# Patient Record
Sex: Female | Born: 1975 | Race: White | Hispanic: No | Marital: Married | Smoking: Never smoker
Health system: Southern US, Community
[De-identification: ages and names within clinical notes are randomized; demographics above are authoritative.]

## PROBLEM LIST (undated history)

## (undated) ENCOUNTER — Inpatient Hospital Stay (HOSPITAL_COMMUNITY): Payer: Self-pay

## (undated) DIAGNOSIS — O99345 Other mental disorders complicating the puerperium: Secondary | ICD-10-CM

## (undated) DIAGNOSIS — F319 Bipolar disorder, unspecified: Secondary | ICD-10-CM

## (undated) DIAGNOSIS — T4145XA Adverse effect of unspecified anesthetic, initial encounter: Secondary | ICD-10-CM

## (undated) DIAGNOSIS — F53 Postpartum depression: Secondary | ICD-10-CM

## (undated) DIAGNOSIS — I639 Cerebral infarction, unspecified: Secondary | ICD-10-CM

## (undated) DIAGNOSIS — D649 Anemia, unspecified: Secondary | ICD-10-CM

## (undated) DIAGNOSIS — T8859XA Other complications of anesthesia, initial encounter: Secondary | ICD-10-CM

## (undated) DIAGNOSIS — R51 Headache: Secondary | ICD-10-CM

## (undated) DIAGNOSIS — I10 Essential (primary) hypertension: Secondary | ICD-10-CM

## (undated) DIAGNOSIS — R519 Headache, unspecified: Secondary | ICD-10-CM

## (undated) HISTORY — DX: Postpartum depression: F53.0

## (undated) HISTORY — DX: Headache: R51

## (undated) HISTORY — DX: Headache, unspecified: R51.9

## (undated) HISTORY — DX: Other mental disorders complicating the puerperium: O99.345

## (undated) HISTORY — DX: Anemia, unspecified: D64.9

## (undated) HISTORY — PX: APPENDECTOMY: SHX54

## (undated) HISTORY — DX: Adverse effect of unspecified anesthetic, initial encounter: T41.45XA

## (undated) HISTORY — DX: Other complications of anesthesia, initial encounter: T88.59XA

## (undated) HISTORY — DX: Cerebral infarction, unspecified: I63.9

## (undated) HISTORY — PX: MOUTH SURGERY: SHX715

---

## 2004-10-28 ENCOUNTER — Emergency Department (HOSPITAL_COMMUNITY): Admission: EM | Admit: 2004-10-28 | Discharge: 2004-10-28 | Payer: Self-pay | Admitting: Emergency Medicine

## 2008-05-20 ENCOUNTER — Emergency Department (HOSPITAL_BASED_OUTPATIENT_CLINIC_OR_DEPARTMENT_OTHER): Admission: EM | Admit: 2008-05-20 | Discharge: 2008-05-20 | Payer: Self-pay | Admitting: Emergency Medicine

## 2008-05-21 ENCOUNTER — Ambulatory Visit (HOSPITAL_BASED_OUTPATIENT_CLINIC_OR_DEPARTMENT_OTHER): Admission: RE | Admit: 2008-05-21 | Discharge: 2008-05-21 | Payer: Self-pay | Admitting: Emergency Medicine

## 2009-08-31 ENCOUNTER — Emergency Department (HOSPITAL_BASED_OUTPATIENT_CLINIC_OR_DEPARTMENT_OTHER): Admission: EM | Admit: 2009-08-31 | Discharge: 2009-08-31 | Payer: Self-pay | Admitting: Emergency Medicine

## 2010-05-22 IMAGING — CT CT HEAD W/O CM
1 series · 16 of 30 positions shown, 20 images · non-contrast
Comparison: Head CT 10/28/2004

CLINICAL DATA: Headache

CT HEAD WITHOUT CONTRAST
TECHNIQUE: Contiguous axial images were obtained from the base of
the skull through the vertex without contrast.

[Series 2: head 4.8 h37s · axial · 0.41mm/px · z∈[-139,-6]mm · 16 of 32 slices shown, 20 images]
[im 2/32  brain]
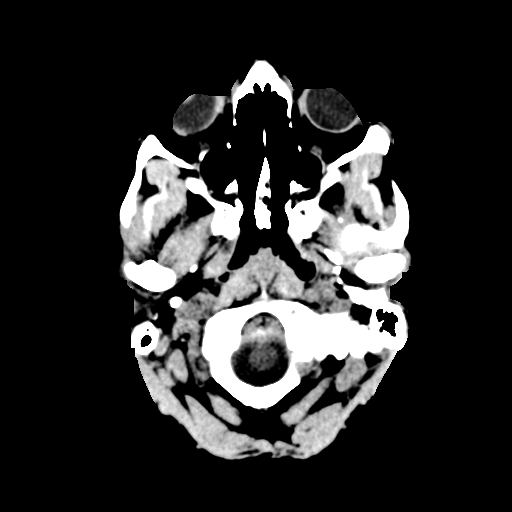
[im 2/32  bone]
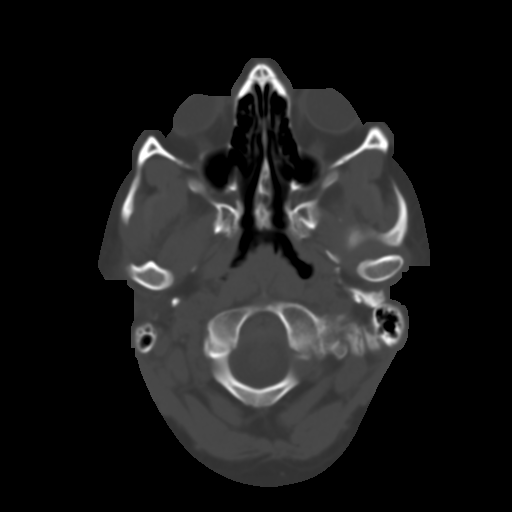
[im 4/32  brain]
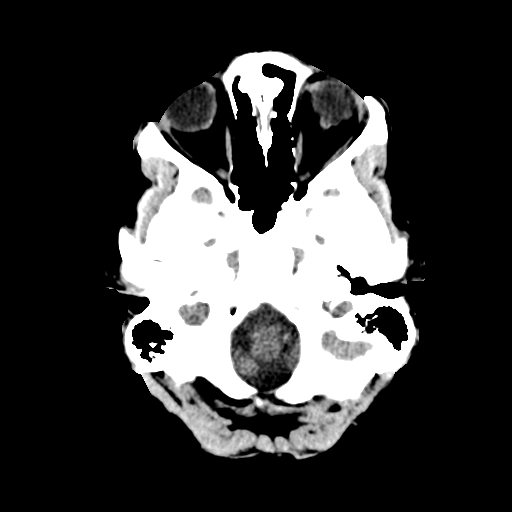
[im 6/32  brain]
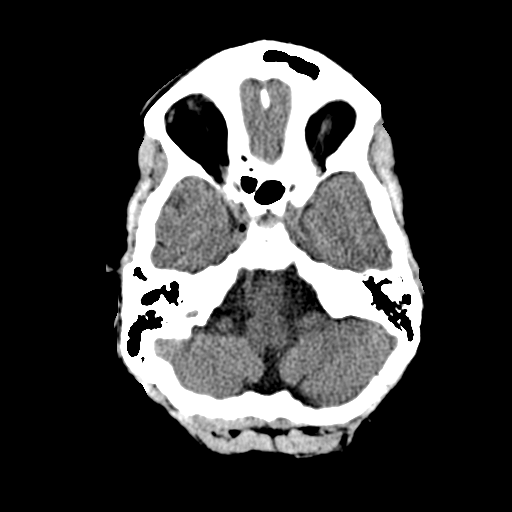
[im 8/32  brain]
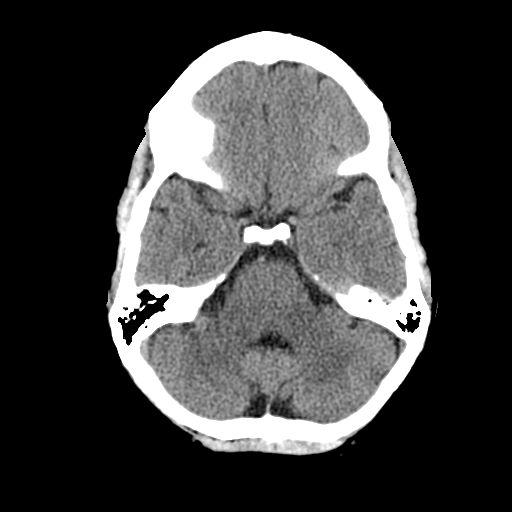
[im 9/32  brain]
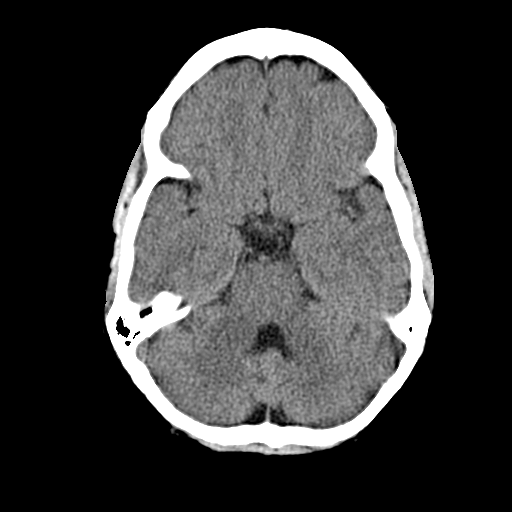
[im 9/32  bone]
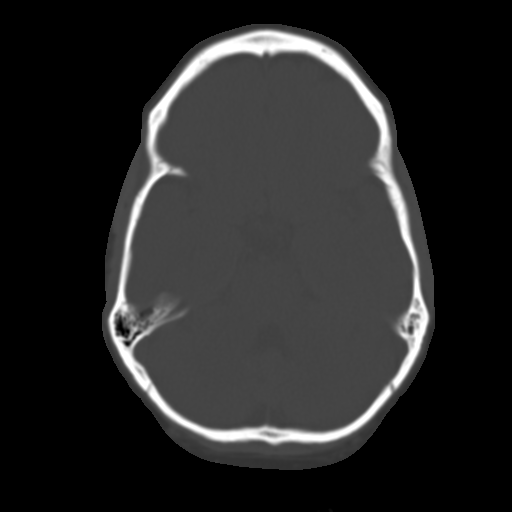
[im 11/32  brain]
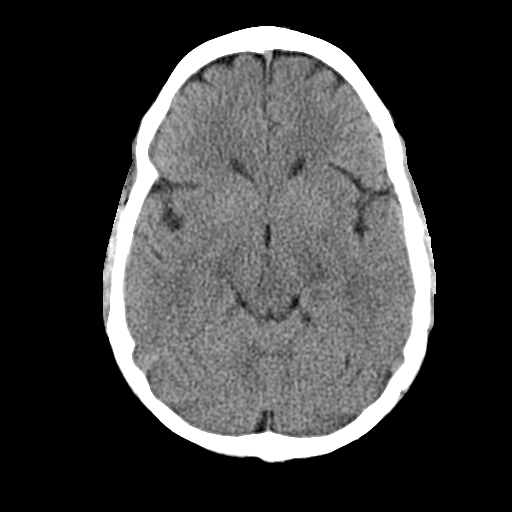
[im 13/32  brain]
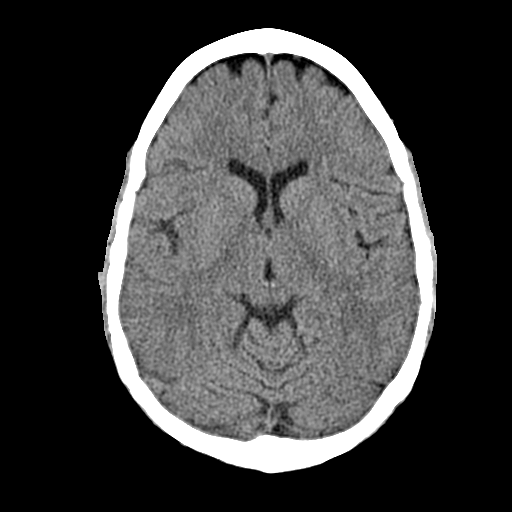
[im 15/32  brain]
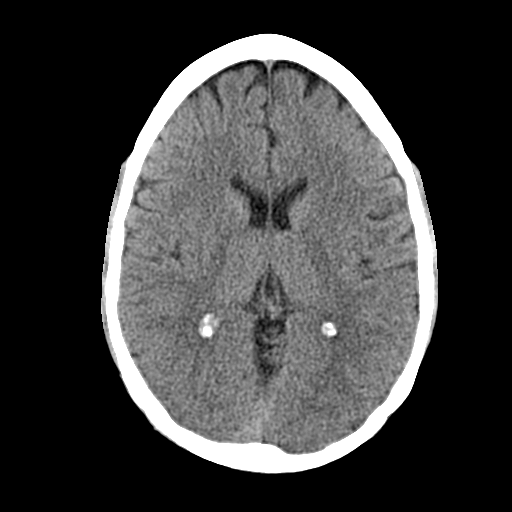
[im 17/32  brain]
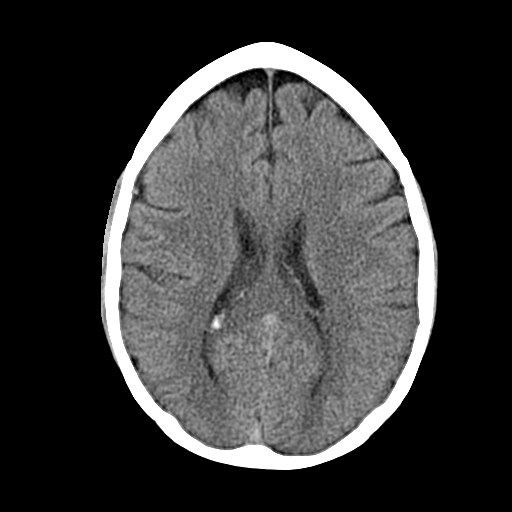
[im 17/32  bone]
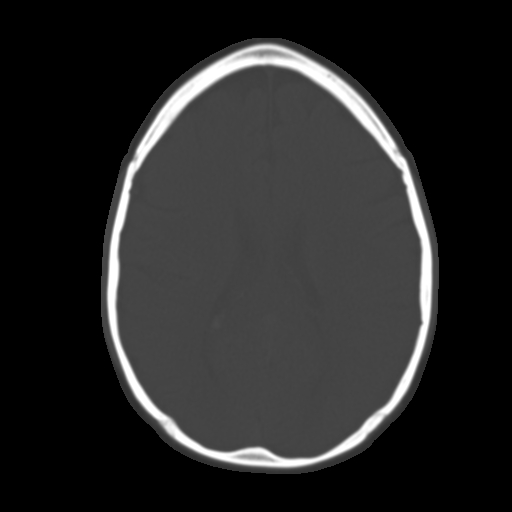
[im 19/32  brain]
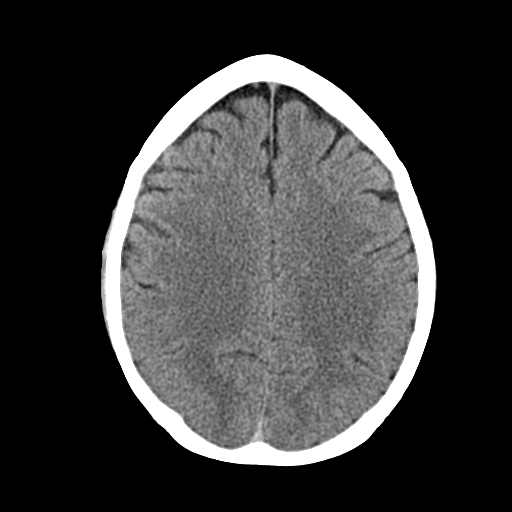
[im 21/32  brain]
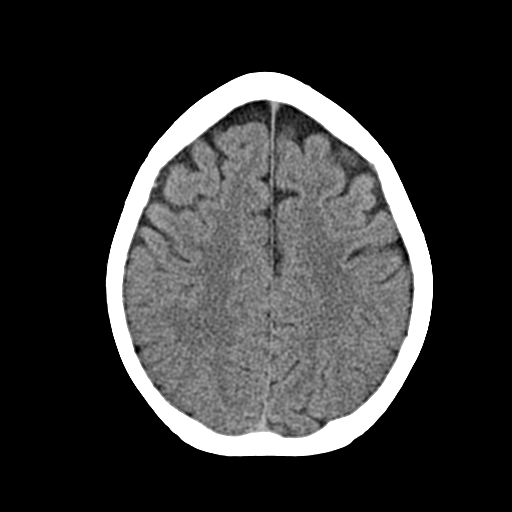
[im 23/32  brain]
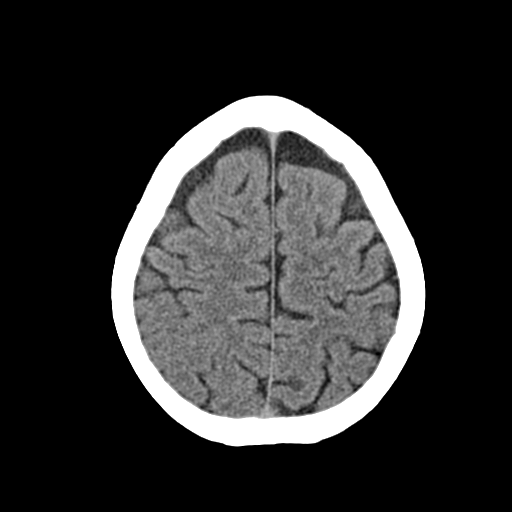
[im 24/32  brain]
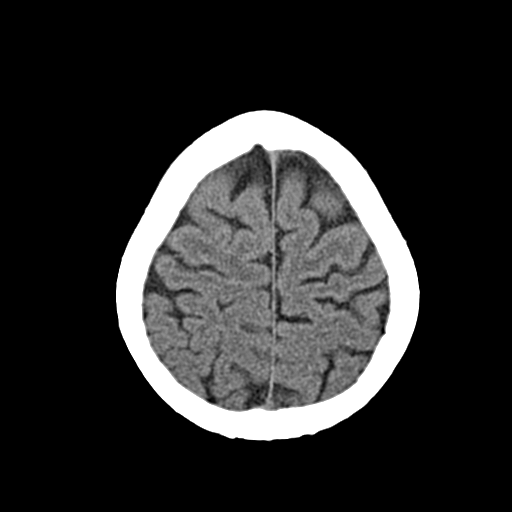
[im 24/32  bone]
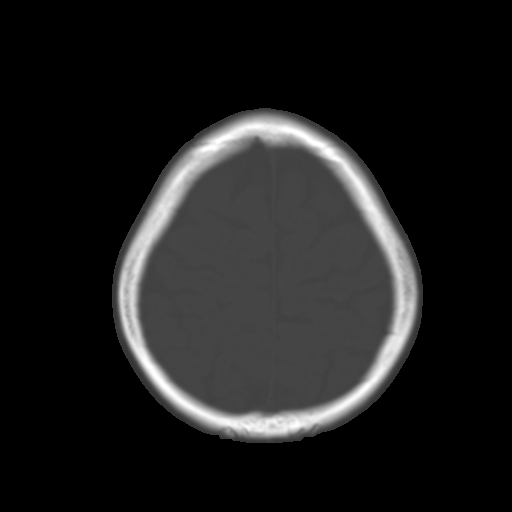
[im 26/32  brain]
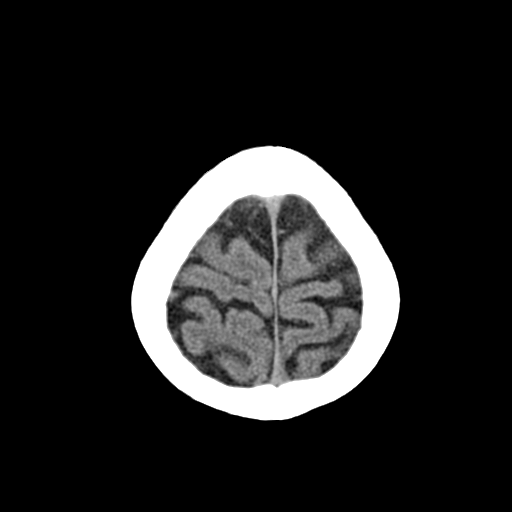
[im 28/32  brain]
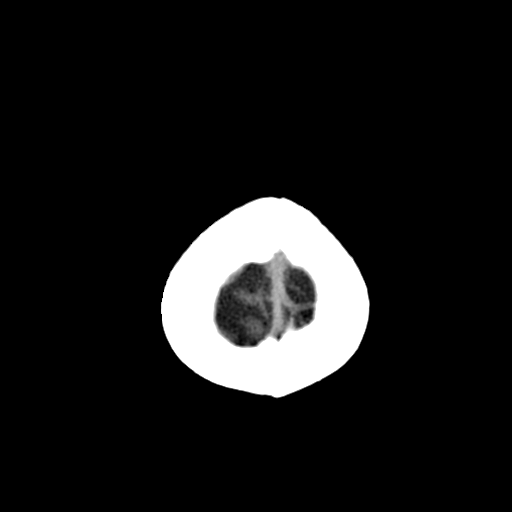
[im 30/32  brain]
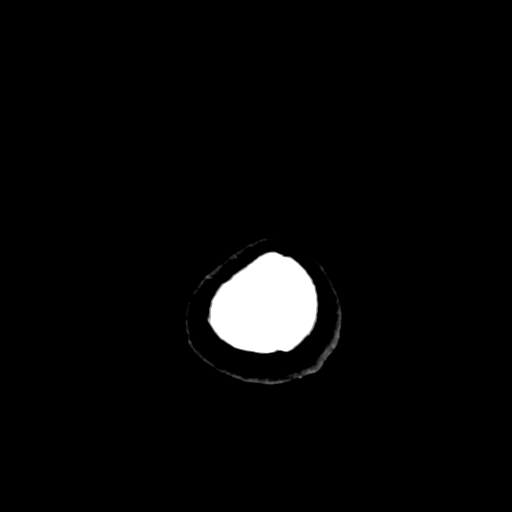

[16 of 30 positions shown; findings below may reference images not displayed]

FINDINGS: No change.  The brain continues to have a normal
appearance without evidence of infarction, mass lesion, hemorrhage,
hydrocephalus or extra-axial collection.  Visualized sinuses,
middle ears and mastoids are clear.  No calvarial abnormality.
IMPRESSION: Normal examination

## 2010-10-12 LAB — CBC
HCT: 41.7 % (ref 36.0–46.0)
Hemoglobin: 14.8 g/dL (ref 12.0–15.0)
MCHC: 35.6 g/dL (ref 30.0–36.0)
MCV: 86.3 fL (ref 78.0–100.0)
Platelets: 344 10*3/uL (ref 150–400)
RBC: 4.82 MIL/uL (ref 3.87–5.11)
RDW: 13 % (ref 11.5–15.5)
WBC: 14.6 10*3/uL — ABNORMAL HIGH (ref 4.0–10.5)

## 2010-10-12 LAB — URINALYSIS, ROUTINE W REFLEX MICROSCOPIC
Glucose, UA: NEGATIVE mg/dL
Ketones, ur: 15 mg/dL — AB
Urobilinogen, UA: 1 mg/dL (ref 0.0–1.0)

## 2010-10-12 LAB — DIFFERENTIAL
Basophils Absolute: 0.1 10*3/uL (ref 0.0–0.1)
Basophils Relative: 1 % (ref 0–1)
Eosinophils Absolute: 0.1 10*3/uL (ref 0.0–0.7)
Eosinophils Relative: 0 % (ref 0–5)
Lymphocytes Relative: 3 % — ABNORMAL LOW (ref 12–46)
Lymphs Abs: 0.5 10*3/uL — ABNORMAL LOW (ref 0.7–4.0)
Monocytes Absolute: 0.4 10*3/uL (ref 0.1–1.0)
Monocytes Relative: 3 % (ref 3–12)
Neutro Abs: 13.4 10*3/uL — ABNORMAL HIGH (ref 1.7–7.7)
Neutrophils Relative %: 93 % — ABNORMAL HIGH (ref 43–77)

## 2010-10-12 LAB — COMPREHENSIVE METABOLIC PANEL
ALT: 11 U/L (ref 0–35)
Alkaline Phosphatase: 78 U/L (ref 39–117)
Chloride: 108 mEq/L (ref 96–112)
GFR calc Af Amer: >60 mL/min (ref >60)
Glucose, Bld: 122 mg/dL — ABNORMAL HIGH (ref 70–99)
Potassium: 4 mEq/L (ref 3.5–5.1)
Sodium: 143 mEq/L (ref 135–145)

## 2010-10-12 LAB — URINE MICROSCOPIC-ADD ON

## 2011-07-24 DIAGNOSIS — I639 Cerebral infarction, unspecified: Secondary | ICD-10-CM

## 2011-07-24 HISTORY — DX: Cerebral infarction, unspecified: I63.9

## 2012-04-20 ENCOUNTER — Emergency Department (HOSPITAL_COMMUNITY)
Admission: EM | Admit: 2012-04-20 | Discharge: 2012-04-20 | Discharge status: Against Medical Advice | Payer: Self-pay | Attending: Emergency Medicine | Admitting: Emergency Medicine

## 2012-04-20 ENCOUNTER — Encounter (HOSPITAL_COMMUNITY): Payer: Self-pay | Admitting: Emergency Medicine

## 2012-04-20 DIAGNOSIS — R55 Syncope and collapse: Secondary | ICD-10-CM | POA: Insufficient documentation

## 2012-04-20 DIAGNOSIS — R5383 Other fatigue: Secondary | ICD-10-CM | POA: Insufficient documentation

## 2012-04-20 DIAGNOSIS — R5381 Other malaise: Secondary | ICD-10-CM | POA: Insufficient documentation

## 2012-04-20 HISTORY — DX: Essential (primary) hypertension: I10

## 2012-04-20 HISTORY — DX: Bipolar disorder, unspecified: F31.9

## 2012-04-20 NOTE — ED Notes (Signed)
The pt decided to leave waited too long

## 2012-04-20 NOTE — ED Notes (Signed)
Pt on new mental health medications- olanzapine 15 mg; oxcarbazepine 600mg ; Reports she takes metoprolol for HTN.

## 2012-04-20 NOTE — ED Notes (Signed)
Pt has been evaluated for numbness in arms and face; weakness in legs; and waking up with palpations. Reports seen at [place], [facility], [place], [place]. PT reports they treat her BP and send her home. EMS BP 148/108

## 2012-05-12 ENCOUNTER — Emergency Department (HOSPITAL_COMMUNITY)
Admission: EM | Admit: 2012-05-12 | Discharge: 2012-05-12 | Disposition: A | Discharge status: Home / Self Care | Payer: BC Managed Care – PPO | Attending: Emergency Medicine | Admitting: Emergency Medicine

## 2012-05-12 ENCOUNTER — Encounter (HOSPITAL_COMMUNITY): Payer: Self-pay | Admitting: Unknown Physician Specialty

## 2012-05-12 DIAGNOSIS — F319 Bipolar disorder, unspecified: Secondary | ICD-10-CM | POA: Insufficient documentation

## 2012-05-12 DIAGNOSIS — Z0389 Encounter for observation for other suspected diseases and conditions ruled out: Secondary | ICD-10-CM | POA: Insufficient documentation

## 2012-05-12 DIAGNOSIS — R531 Weakness: Secondary | ICD-10-CM

## 2012-05-12 DIAGNOSIS — R5381 Other malaise: Secondary | ICD-10-CM | POA: Insufficient documentation

## 2012-05-12 DIAGNOSIS — R5383 Other fatigue: Secondary | ICD-10-CM | POA: Insufficient documentation

## 2012-05-12 DIAGNOSIS — I10 Essential (primary) hypertension: Secondary | ICD-10-CM | POA: Insufficient documentation

## 2012-05-12 LAB — URINALYSIS, ROUTINE W REFLEX MICROSCOPIC
Bilirubin Urine: NEGATIVE
Glucose, UA: NEGATIVE mg/dL
Ketones, ur: NEGATIVE mg/dL
Leukocytes, UA: NEGATIVE
Nitrite: NEGATIVE
Specific Gravity, Urine: 1.006 (ref 1.005–1.030)
pH: 7.5 (ref 5.0–8.0)

## 2012-05-12 LAB — CBC WITH DIFFERENTIAL/PLATELET
Basophils Relative: 1 % (ref 0–1)
HCT: 37.9 % (ref 36.0–46.0)
Lymphocytes Relative: 20 % (ref 12–46)
MCHC: 32.7 g/dL (ref 30.0–36.0)
MCV: 87.5 fL (ref 78.0–100.0)
Monocytes Relative: 7 % (ref 3–12)
Neutro Abs: 6.1 10*3/uL (ref 1.7–7.7)
Neutrophils Relative %: 73 % (ref 43–77)

## 2012-05-12 LAB — COMPREHENSIVE METABOLIC PANEL
Albumin: 4 g/dL (ref 3.5–5.2)
Alkaline Phosphatase: 63 U/L (ref 39–117)
CO2: 26 mEq/L (ref 19–32)
Chloride: 103 mEq/L (ref 96–112)
Creatinine, Ser: 0.61 mg/dL (ref 0.50–1.10)
Potassium: 3.6 mEq/L (ref 3.5–5.1)
Total Bilirubin: 0.3 mg/dL (ref 0.3–1.2)
Total Protein: 7 g/dL (ref 6.0–8.3)

## 2012-05-12 LAB — POCT PREGNANCY, URINE: Preg Test, Ur: NEGATIVE

## 2012-05-12 NOTE — ED Notes (Signed)
Patient states since Sept 6, 2013 she has been feeling weak, facial and head numbness, leg cramping, hands draw up and hypertension. She states she has been checking her BP everyday and states her hypertension medication only keeps her BP down for apprx 15 min.

## 2012-05-12 NOTE — ED Notes (Signed)
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 Patient at this time has no symptoms. Denies nausea, vomiting, diarrhea, abdominal pain and neuro assessment is negative. PA and EDP have been updated.
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
## 2012-05-12 NOTE — ED Notes (Signed)
Food tray given to patient 

## 2012-05-13 NOTE — ED Provider Notes (Signed)
Medical screening examination/treatment/procedure(s) were performed by non-physician practitioner and as supervising physician I was immediately available for consultation/collaboration.   David H Yao, MD 05/13/12 0731 

## 2012-05-19 LAB — MISCELLANEOUS TEST

## 2014-12-15 LAB — HM PAP SMEAR: HM Pap smear: NEGATIVE

## 2016-02-20 DIAGNOSIS — Z8659 Personal history of other mental and behavioral disorders: Secondary | ICD-10-CM | POA: Insufficient documentation

## 2016-02-20 LAB — OB RESULTS CONSOLE HGB/HCT, BLOOD
HEMATOCRIT: 37 %
Hemoglobin: 11.8 g/dL

## 2016-02-20 LAB — OB RESULTS CONSOLE ANTIBODY SCREEN: ANTIBODY SCREEN: NEGATIVE

## 2016-02-20 LAB — OB RESULTS CONSOLE ABO/RH: RH TYPE: POSITIVE

## 2016-02-20 LAB — OB RESULTS CONSOLE HIV ANTIBODY (ROUTINE TESTING): HIV: NONREACTIVE

## 2016-02-20 LAB — OB RESULTS CONSOLE HEPATITIS B SURFACE ANTIGEN: Hepatitis B Surface Ag: NEGATIVE

## 2016-02-20 LAB — OB RESULTS CONSOLE RPR: RPR: NONREACTIVE

## 2016-02-20 LAB — OB RESULTS CONSOLE RUBELLA ANTIBODY, IGM: Rubella: IMMUNE

## 2016-02-21 LAB — OB RESULTS CONSOLE GC/CHLAMYDIA
Chlamydia: NEGATIVE
GC PROBE AMP, GENITAL: NEGATIVE

## 2016-04-30 DIAGNOSIS — O09529 Supervision of elderly multigravida, unspecified trimester: Secondary | ICD-10-CM | POA: Insufficient documentation

## 2016-05-08 ENCOUNTER — Encounter (HOSPITAL_COMMUNITY): Payer: Self-pay | Admitting: Emergency Medicine

## 2016-05-08 ENCOUNTER — Emergency Department (HOSPITAL_COMMUNITY)
Admission: EM | Admit: 2016-05-08 | Discharge: 2016-05-08 | Disposition: A | Discharge status: Home / Self Care | Payer: Medicare Other | Attending: Emergency Medicine | Admitting: Emergency Medicine

## 2016-05-08 DIAGNOSIS — O9989 Other specified diseases and conditions complicating pregnancy, childbirth and the puerperium: Secondary | ICD-10-CM | POA: Insufficient documentation

## 2016-05-08 DIAGNOSIS — O10912 Unspecified pre-existing hypertension complicating pregnancy, second trimester: Secondary | ICD-10-CM | POA: Diagnosis not present

## 2016-05-08 DIAGNOSIS — Z79899 Other long term (current) drug therapy: Secondary | ICD-10-CM | POA: Diagnosis not present

## 2016-05-08 DIAGNOSIS — R51 Headache: Secondary | ICD-10-CM | POA: Diagnosis not present

## 2016-05-08 DIAGNOSIS — Z3A21 21 weeks gestation of pregnancy: Secondary | ICD-10-CM | POA: Insufficient documentation

## 2016-05-08 DIAGNOSIS — R202 Paresthesia of skin: Secondary | ICD-10-CM | POA: Insufficient documentation

## 2016-05-08 DIAGNOSIS — R519 Headache, unspecified: Secondary | ICD-10-CM

## 2016-05-08 LAB — URINALYSIS, ROUTINE W REFLEX MICROSCOPIC
BILIRUBIN URINE: NEGATIVE
Glucose, UA: NEGATIVE mg/dL
Hgb urine dipstick: NEGATIVE
KETONES UR: NEGATIVE mg/dL
Leukocytes, UA: NEGATIVE
NITRITE: NEGATIVE
Protein, ur: NEGATIVE mg/dL
SPECIFIC GRAVITY, URINE: 1.009 (ref 1.005–1.030)
pH: 7 (ref 5.0–8.0)

## 2016-05-08 LAB — COMPREHENSIVE METABOLIC PANEL
ALT: 15 U/L (ref 14–54)
ANION GAP: 7 (ref 5–15)
AST: 21 U/L (ref 15–41)
Albumin: 3 g/dL — ABNORMAL LOW (ref 3.5–5.0)
Alkaline Phosphatase: 43 U/L (ref 38–126)
BUN: <5 mg/dL — ABNORMAL LOW (ref 6–20)
CHLORIDE: 109 mmol/L (ref 101–111)
CO2: 20 mmol/L — ABNORMAL LOW (ref 22–32)
CREATININE: 0.67 mg/dL (ref 0.44–1.00)
Calcium: 8.8 mg/dL — ABNORMAL LOW (ref 8.9–10.3)
Glucose, Bld: 98 mg/dL (ref 65–99)
Potassium: 3.2 mmol/L — ABNORMAL LOW (ref 3.5–5.1)
Sodium: 136 mmol/L (ref 135–145)
Total Bilirubin: 0.6 mg/dL (ref 0.3–1.2)
Total Protein: 5.7 g/dL — ABNORMAL LOW (ref 6.5–8.1)

## 2016-05-08 LAB — CBC WITH DIFFERENTIAL/PLATELET
Basophils Absolute: 0.1 10*3/uL (ref 0.0–0.1)
Basophils Relative: 1 %
EOS PCT: 2 %
Eosinophils Absolute: 0.2 10*3/uL (ref 0.0–0.7)
HCT: 32.3 % — ABNORMAL LOW (ref 36.0–46.0)
Hemoglobin: 10.6 g/dL — ABNORMAL LOW (ref 12.0–15.0)
LYMPHS ABS: 1.9 10*3/uL (ref 0.7–4.0)
LYMPHS PCT: 21 %
MCH: 28.9 pg (ref 26.0–34.0)
MCHC: 32.8 g/dL (ref 30.0–36.0)
MCV: 88 fL (ref 78.0–100.0)
MONO ABS: 0.8 10*3/uL (ref 0.1–1.0)
MONOS PCT: 9 %
Neutro Abs: 5.9 10*3/uL (ref 1.7–7.7)
Neutrophils Relative %: 67 %
PLATELETS: 359 10*3/uL (ref 150–400)
RBC: 3.67 MIL/uL — AB (ref 3.87–5.11)
RDW: 15.3 % (ref 11.5–15.5)
WBC: 8.8 10*3/uL (ref 4.0–10.5)

## 2016-05-08 MED ORDER — METOCLOPRAMIDE HCL 5 MG/ML IJ SOLN
10.0000 mg | Freq: Once | INTRAMUSCULAR | Status: DC
  Filled 2016-05-08: qty 2

## 2016-05-08 MED ORDER — DIPHENHYDRAMINE HCL 50 MG/ML IJ SOLN
25.0000 mg | Freq: Once | INTRAMUSCULAR | Status: DC
  Filled 2016-05-08: qty 1

## 2016-05-08 MED ORDER — ACETAMINOPHEN 500 MG PO TABS
1000.0000 mg | ORAL_TABLET | Freq: Once | ORAL | Status: AC
  Administered 2016-05-08: 1000 mg via ORAL
  Filled 2016-05-08: qty 2

## 2016-05-08 MED ORDER — SODIUM CHLORIDE 0.9 % IV BOLUS (SEPSIS)
1000.0000 mL | Freq: Once | INTRAVENOUS | Status: AC
  Administered 2016-05-08: 1000 mL via INTRAVENOUS

## 2016-05-08 NOTE — ED Triage Notes (Signed)
Pt arrived [facility] from home with c/o numbness starting with her whole body at around 1145PM, and moving to just her arms, currently just her right arm. Pt is [redacted] weeks pregnant, and has a hx of anxiety, HTN, and TIA.

## 2016-05-08 NOTE — ED Provider Notes (Addendum)
[facility]-EMERGENCY DEPT Provider Note   CSN: [id number] Arrival date & time: 05/08/16  7829   By signing my name below, I, Teofilo Pod, attest that this documentation has been prepared under the direction and in the presence of Melene Plan, DO . Electronically Signed: Teofilo Pod, ED Scribe. 05/08/2016. 4:07 AM.   History   Chief Complaint Chief Complaint  Patient presents with  . Numbness   The history is provided by the patient. No language interpreter was used.  Headache   This is a chronic problem. The current episode started more than 1 week ago. The problem occurs constantly. The problem has been gradually improving. The headache is associated with nothing. Pain location: vernix of head. The pain is at a severity of 6/10. The pain is moderate. The pain does not radiate. Pertinent negatives include no fever, no palpitations, no shortness of breath, no nausea and no vomiting. She has tried acetaminophen for the symptoms. The treatment provided no relief.   HPI Comments:  Carolyn Wright is a 40 y.o. female with PMHx of bipolar disorder who presents to the Emergency Department complaining of constant generalized numbness x 3 hours. Pt states that she fell asleep yesterday evening, woke up, and noticed that she was generally numb, primarily in the arms. Pt complains of associated headache, dizziness, tingling to her right hand, abdominal cramping that began yesterday. Pt rates her current pain at 5/10. Pt states that yesterday she was abnormally stressful and that she has been having significant family issues lately. Pt reports that she was brought to the ED by ambulance for similar symptoms in 2013. Pt reports previous history of psychiatric issues associated with her medications. Pt was unable to reach her PCP. Pt has taken tylenol with no relief. Pt denies any recent injury/trauma. Pt denies visual disturbance, vaginal bleeding.   Past Medical History:  Diagnosis Date  .  Bipolar 1 disorder (HCC)   . Hypertension     There are no active problems to display for this patient.   History reviewed. No pertinent surgical history.  OB History    No data available       Home Medications    Prior to Admission medications   Medication Sig Start Date End Date Taking? Authorizing Provider  b complex vitamins capsule Take 1 capsule by mouth daily.    Historical Provider, MD  butalbital-acetaminophen-caffeine (FIORICET, ESGIC) 50-325-40 MG per tablet Take 1 tablet by mouth 2 (two) times daily as needed. For headaches    Historical Provider, MD  Cholecalciferol (VITAMIN D) 2000 UNITS CAPS Take 1 capsule by mouth daily.    Historical Provider, MD  metoprolol (LOPRESSOR) 50 MG tablet Take 50 mg by mouth 2 (two) times daily.    Historical Provider, MD  OVER THE COUNTER MEDICATION Take 1 tablet by mouth daily.    Historical Provider, MD    Family History History reviewed. No pertinent family history.  Social History Social History  Substance Use Topics  . Smoking status: Never Smoker  . Smokeless tobacco: Never Used  . Alcohol use No     Allergies   Depakote [divalproex sodium]; Geodon [ziprasidone hcl]; and Penicillins   Review of Systems Review of Systems  Constitutional: Negative for chills and fever.  HENT: Negative for congestion and rhinorrhea.   Eyes: Negative for redness and visual disturbance.  Respiratory: Negative for shortness of breath and wheezing.   Cardiovascular: Negative for chest pain and palpitations.  Gastrointestinal: Positive for abdominal pain. Negative for  nausea and vomiting.  Genitourinary: Negative for dysuria, urgency and vaginal discharge.  Musculoskeletal: Negative for arthralgias and myalgias.  Skin: Negative for pallor and wound.  Neurological: Positive for dizziness, numbness and headaches.  All other systems reviewed and are negative.    Physical Exam Updated Vital Signs BP 160/92   Pulse 106   Resp 19    SpO2 100%   Physical Exam  Constitutional: She is oriented to person, place, and time. She appears well-developed and well-nourished. No distress.  HENT:  Head: Normocephalic and atraumatic.  Eyes: EOM are normal. Pupils are equal, round, and reactive to light.  Neck: Normal range of motion. Neck supple.  Cardiovascular: Normal rate and regular rhythm.  Exam reveals no gallop and no friction rub.   No murmur heard. Pulmonary/Chest: Effort normal. She has no wheezes. She has no rales.  Abdominal: Soft. She exhibits no distension. There is no tenderness.  Musculoskeletal: She exhibits no edema or tenderness.  Neurological: She is alert and oriented to person, place, and time. She has normal strength. No cranial nerve deficit or sensory deficit. She displays a negative Romberg sign. Coordination and gait normal. GCS eye subscore is 4. GCS verbal subscore is 5. GCS motor subscore is 6. She displays no Babinski's sign on the right side. She displays no Babinski's sign on the left side.  Skin: Skin is warm and dry. She is not diaphoretic.  Psychiatric: She has a normal mood and affect. Her behavior is normal.  Nursing note and vitals reviewed.    ED Treatments / Results  DIAGNOSTIC STUDIES:  Oxygen Saturation is 98% on RA, normal by my interpretation.    COORDINATION OF CARE:  4:07 AM Discussed treatment plan with pt at bedside and pt agreed to plan.   Labs (all labs ordered are listed, but only abnormal results are displayed) Labs Reviewed  CBC WITH DIFFERENTIAL/PLATELET - Abnormal; Notable for the following:       Result Value   RBC 3.67 (*)    Hemoglobin 10.6 (*)    HCT 32.3 (*)    All other components within normal limits  COMPREHENSIVE METABOLIC PANEL - Abnormal; Notable for the following:    Potassium 3.2 (*)    CO2 20 (*)    BUN <5 (*)    Calcium 8.8 (*)    Total Protein 5.7 (*)    Albumin 3.0 (*)    All other components within normal limits  URINALYSIS, ROUTINE W  REFLEX MICROSCOPIC (NOT AT [facility])    EKG  EKG Interpretation None       Radiology No results found.  Procedures Procedures (including critical care time)  Medications Ordered in ED Medications  metoCLOPramide (REGLAN) injection 10 mg (10 mg Intravenous Refused 05/08/16 0453)  diphenhydrAMINE (BENADRYL) injection 25 mg (25 mg Intravenous Refused 05/08/16 0454)  sodium chloride 0.9 % bolus 1,000 mL (0 mLs Intravenous Stopped 05/08/16 0611)  acetaminophen (TYLENOL) tablet 1,000 mg (1,000 mg Oral Given 05/08/16 0604)     Initial Impression / Assessment and Plan / ED Course  I have reviewed the triage vital signs and the nursing notes.  Pertinent labs & imaging results that were available during my care of the patient were reviewed by me and considered in my medical decision making (see chart for details).  Clinical Course    40 yo F with paresthesias.  Going on for past couple days, usually occurs with stress. Patient has hx about 3 years ago with episodes that sound stress related  vs PRESS.  On labetalol for BP, [redacted] weeks pregnant.  No complications thus far.  Labs without findings consistent with pre-eclampsia.  Symptoms improved without intervention.  Will have follow up with OB for BP management.   6:30 AM:  I have discussed the diagnosis/risks/treatment options with the patient and family and believe the pt to be eligible for discharge home to follow-up with OB. We also discussed returning to the ED immediately if new or worsening sx occur. We discussed the sx which are most concerning (e.g., sudden worsening pain, fever, inability to tolerate by mouth) that necessitate immediate return. Medications administered to the patient during their visit and any new prescriptions provided to the patient are listed below.  Medications given during this visit Medications  metoCLOPramide (REGLAN) injection 10 mg (10 mg Intravenous Refused 05/08/16 0453)  diphenhydrAMINE (BENADRYL)  injection 25 mg (25 mg Intravenous Refused 05/08/16 0454)  sodium chloride 0.9 % bolus 1,000 mL (0 mLs Intravenous Stopped 05/08/16 0611)  acetaminophen (TYLENOL) tablet 1,000 mg (1,000 mg Oral Given 05/08/16 0604)     The patient appears reasonably screen and/or stabilized for discharge and I doubt any other medical condition or other [facility] requiring further screening, evaluation, or treatment in the ED at this time prior to discharge.     Final Clinical Impressions(s) / ED Diagnoses   Final diagnoses:  Paresthesia  Nonintractable headache, unspecified chronicity pattern, unspecified headache type    New Prescriptions New Prescriptions   No medications on file  I personally performed the services described in this documentation, which was scribed in my presence. The recorded information has been reviewed and is accurate.      Melene Planan Nasiah Lehenbauer, DO 05/08/16 0630    Melene Planan Ghislaine Harcum, DO 05/08/16 0630

## 2016-05-08 NOTE — Discharge Instructions (Signed)
Discuss your BP with your OB.

## 2016-05-09 DIAGNOSIS — F411 Generalized anxiety disorder: Secondary | ICD-10-CM | POA: Insufficient documentation

## 2016-05-09 LAB — BASIC METABOLIC PANEL
BUN: 5 mg/dL (ref 4–21)
Creatinine: 0.6 mg/dL (ref 0.5–1.1)
SODIUM: 138 mmol/L (ref 137–147)

## 2016-05-09 LAB — HEPATIC FUNCTION PANEL: Bilirubin, Total: 0.2 mg/dL

## 2016-05-11 DIAGNOSIS — F312 Bipolar disorder, current episode manic severe with psychotic features: Secondary | ICD-10-CM | POA: Insufficient documentation

## 2016-05-12 LAB — LIPID PANEL
CHOLESTEROL: 205 mg/dL — AB (ref 0–200)
HDL: 42 mg/dL (ref 35–70)
LDL CALC: 120 mg/dL
Triglycerides: 217 mg/dL — AB (ref 40–160)

## 2016-05-12 LAB — BASIC METABOLIC PANEL: Glucose: 139 mg/dL

## 2016-05-18 LAB — OB RESULTS CONSOLE TSH: TSH: 1.57

## 2016-05-18 LAB — TSH: TSH: 1.57 u[IU]/mL (ref 0.41–5.90)

## 2016-05-20 ENCOUNTER — Encounter (HOSPITAL_COMMUNITY): Payer: Self-pay | Admitting: *Deleted

## 2016-05-20 ENCOUNTER — Emergency Department (HOSPITAL_COMMUNITY)
Admission: EM | Admit: 2016-05-20 | Discharge: 2016-05-20 | Disposition: A | Discharge status: Home / Self Care | Payer: Medicare Other | Attending: Emergency Medicine | Admitting: Emergency Medicine

## 2016-05-20 DIAGNOSIS — Z7982 Long term (current) use of aspirin: Secondary | ICD-10-CM | POA: Insufficient documentation

## 2016-05-20 DIAGNOSIS — Z79899 Other long term (current) drug therapy: Secondary | ICD-10-CM

## 2016-05-20 DIAGNOSIS — Z880 Allergy status to penicillin: Secondary | ICD-10-CM | POA: Diagnosis not present

## 2016-05-20 DIAGNOSIS — F3010 Manic episode without psychotic symptoms, unspecified: Secondary | ICD-10-CM | POA: Insufficient documentation

## 2016-05-20 DIAGNOSIS — F309 Manic episode, unspecified: Secondary | ICD-10-CM | POA: Diagnosis not present

## 2016-05-20 DIAGNOSIS — Z3A20 20 weeks gestation of pregnancy: Secondary | ICD-10-CM | POA: Diagnosis not present

## 2016-05-20 DIAGNOSIS — Z888 Allergy status to other drugs, medicaments and biological substances status: Secondary | ICD-10-CM

## 2016-05-20 DIAGNOSIS — I10 Essential (primary) hypertension: Secondary | ICD-10-CM | POA: Diagnosis not present

## 2016-05-20 DIAGNOSIS — O99342 Other mental disorders complicating pregnancy, second trimester: Secondary | ICD-10-CM | POA: Diagnosis not present

## 2016-05-20 LAB — VALPROIC ACID LEVEL: Valproic Acid Lvl: <10 ug/mL — ABNORMAL LOW (ref 50.0–100.0)

## 2016-05-20 LAB — CBC
HEMATOCRIT: 37.5 % (ref 36.0–46.0)
HEMOGLOBIN: 12.2 g/dL (ref 12.0–15.0)
MCH: 29.3 pg (ref 26.0–34.0)
MCHC: 32.5 g/dL (ref 30.0–36.0)
MCV: 89.9 fL (ref 78.0–100.0)
Platelets: 481 10*3/uL — ABNORMAL HIGH (ref 150–400)
RBC: 4.17 MIL/uL (ref 3.87–5.11)
RDW: 15.1 % (ref 11.5–15.5)
WBC: 11 10*3/uL — ABNORMAL HIGH (ref 4.0–10.5)

## 2016-05-20 LAB — COMPREHENSIVE METABOLIC PANEL
ALBUMIN: 4 g/dL (ref 3.5–5.0)
ALK PHOS: 56 U/L (ref 38–126)
ALT: 31 U/L (ref 14–54)
AST: 40 U/L (ref 15–41)
Anion gap: 9 (ref 5–15)
BUN: 8 mg/dL (ref 6–20)
CALCIUM: 8.9 mg/dL (ref 8.9–10.3)
CHLORIDE: 105 mmol/L (ref 101–111)
CO2: 21 mmol/L — AB (ref 22–32)
CREATININE: 0.66 mg/dL (ref 0.44–1.00)
GFR calc non Af Amer: >60 mL/min (ref >60)
GLUCOSE: 102 mg/dL — AB (ref 65–99)
Potassium: 3.6 mmol/L (ref 3.5–5.1)
SODIUM: 135 mmol/L (ref 135–145)
Total Bilirubin: 0.4 mg/dL (ref 0.3–1.2)
Total Protein: 7.8 g/dL (ref 6.5–8.1)

## 2016-05-20 LAB — ACETAMINOPHEN LEVEL: Acetaminophen (Tylenol), Serum: <10 ug/mL — ABNORMAL LOW (ref 10–30)

## 2016-05-20 LAB — LITHIUM LEVEL: Lithium Lvl: <0.06 mmol/L — ABNORMAL LOW (ref 0.60–1.20)

## 2016-05-20 LAB — ETHANOL: Alcohol, Ethyl (B): <5 mg/dL (ref <5)

## 2016-05-20 LAB — SALICYLATE LEVEL: Salicylate Lvl: <7 mg/dL (ref 2.8–30.0)

## 2016-05-20 MED ORDER — HALOPERIDOL LACTATE 5 MG/ML IJ SOLN: 5.0000 mg | Freq: Once | INTRAMUSCULAR | Status: DC

## 2016-05-20 MED ORDER — METOPROLOL TARTRATE 25 MG PO TABS: 50.0000 mg | ORAL_TABLET | Freq: Once | ORAL | Status: DC

## 2016-05-20 MED ORDER — LORAZEPAM 2 MG/ML IJ SOLN
1.0000 mg | Freq: Once | INTRAMUSCULAR | Status: DC
  Filled 2016-05-20: qty 1

## 2016-05-20 NOTE — ED Notes (Signed)
Patient refused EKG.
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
## 2016-05-20 NOTE — ED Notes (Signed)
Per EDP, IVC paperwork has been completed and faxed.  Sts the Pt has been making threats toward others, so she feels the paperwork will hold up.
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
## 2016-05-20 NOTE — ED Notes (Signed)
Patient verbally demanding-attempted to redirect patient with no response-informed MD/PA of patient's requests
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
## 2016-05-20 NOTE — ED Notes (Signed)
PATIENT REFUSED TO HAVE BLOOD COLLECTED

## 2016-05-20 NOTE — ED Notes (Signed)
Pt is talking about her family, she is blaming others for her problems, tearful, difficult to redirect.

## 2016-05-20 NOTE — ED Notes (Signed)
TTS at bedside. 

## 2016-05-20 NOTE — ED Notes (Signed)
Pt refused blood work, sts had everything done this week already.
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
## 2016-05-20 NOTE — Progress Notes (Signed)
CSW logged patient's notice of commitment change paperwork into IVC log book.  

## 2016-05-20 NOTE — ED Notes (Signed)
Communications called to inform that pt is continuously calling 911 from the room because she is afraid she will be IVC'd.  Pt is clearly in a manic state at this time.  Dispatch is going to send a [employer] officer over to sit w/ pt until the decision to d/c or IVC is made.  Phone taken out of room to prevent additional misuse of 911.

## 2016-05-20 NOTE — ED Notes (Addendum)
Pt is very argumentative, threatening to leave if not seen by dr soon. Sts "I'm only here to get something to help me sleep, otherwise I will leave and buy Unisom". She has not stop talking (fast and loud) since she came in. Her husband reports it's been over 2 days since she had any sleep. Husband also reports that while pt was admitted to [facility], she had a habit of hiding her medications under her upper lip and throwing them away afterwards.

## 2016-05-20 NOTE — ED Notes (Signed)
Patient refused Ativan-MD informed of allergy-patient loud, demanding and hyper verbal-unable to redirect at this time-[employer] security outside patient's door-threatening staff-will continue to monitor

## 2016-05-20 NOTE — ED Triage Notes (Signed)
Pt has been manic threatening staff since our arrival at 0700, multiple staff including [employer] off duty have been trying to reason with pt, pt continues to be loud, threatening as well as husband who has been asked to wait in the Waiting Area. Pt now c/o "abd cramping, about to start my period, going to loose my baby" " you can not hold me" " I demand to see my husband" pt refusing VS and threatening the PA in verbal conversation to other staff" At this time we will consult [facility], pt now threatening writer and praying to God asking for the medical field [place] to have repentance" " yelling for strength and threatening staff to have there job". Spoke with [facility] response, Dr Arlyce DicePulumbo speaking with her. Pt singing in loud voice tones. Husband now at bedside with no change in pts behavior.

## 2016-05-23 ENCOUNTER — Encounter (HOSPITAL_COMMUNITY): Payer: Self-pay

## 2016-05-23 ENCOUNTER — Inpatient Hospital Stay (HOSPITAL_COMMUNITY)
Admission: AD | Admit: 2016-05-23 | Discharge: 2016-05-23 | Disposition: A | Discharge status: Home / Self Care | Payer: Medicare Other | Source: Ambulatory Visit | Attending: Family Medicine | Admitting: Family Medicine

## 2016-05-23 DIAGNOSIS — O10012 Pre-existing essential hypertension complicating pregnancy, second trimester: Secondary | ICD-10-CM | POA: Insufficient documentation

## 2016-05-23 DIAGNOSIS — Z3A22 22 weeks gestation of pregnancy: Secondary | ICD-10-CM | POA: Diagnosis not present

## 2016-05-23 DIAGNOSIS — O9989 Other specified diseases and conditions complicating pregnancy, childbirth and the puerperium: Secondary | ICD-10-CM | POA: Diagnosis not present

## 2016-05-23 DIAGNOSIS — R51 Headache: Secondary | ICD-10-CM | POA: Diagnosis present

## 2016-05-23 LAB — URINALYSIS, ROUTINE W REFLEX MICROSCOPIC
BILIRUBIN URINE: NEGATIVE
GLUCOSE, UA: NEGATIVE mg/dL
HGB URINE DIPSTICK: NEGATIVE
KETONES UR: NEGATIVE mg/dL
Leukocytes, UA: NEGATIVE
Nitrite: NEGATIVE
PROTEIN: NEGATIVE mg/dL
Specific Gravity, Urine: 1.025 (ref 1.005–1.030)
pH: 6.5 (ref 5.0–8.0)

## 2016-05-23 NOTE — MAU Provider Note (Signed)
History     CSN: [id number]  Arrival date and time: 05/23/16 0641   None     Chief Complaint  Patient presents with  . Headache   Patient is a 40 yo G4P1 at 7225w2d who presented via EMS with concerns for TIA. Prior to entering the room, patient was heard singing through the close door Per the patient: Patient was at baseline state of health until this morning when she woke from sleep with SOB. Patient states that she woke with whole body numbness as well as complete paralysis of the right side of her body. Patient states that she had complete loss of sensation in all of her extremities, except for tingling in her right hand. 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 These events were witnessed by the patient's father who was home at the time. Father was not present during the interview. Patient states that her symptoms have fully resolved Patient recently [facility] involuntary [facility]. Patient states that she has a court appointment at 1100 this morning.     Past Medical History:  Diagnosis Date  . Bipolar 1 disorder (HCC)   . Hypertension     Past Surgical History:  Procedure Laterality Date  . APPENDECTOMY      History reviewed. No pertinent family history.  Social History  Substance Use Topics  . Smoking status: Never Smoker  . Smokeless tobacco: Never Used  . Alcohol use No    Allergies:  Allergies  Allergen Reactions  . Depakote [Divalproex Sodium] Palpitations  . Geodon [Ziprasidone Hcl]     CARDIAC ARREST  . Zyprexa [Olanzapine] Anxiety    Uncontrollable pacing and shaking.   Marland Kitchen. Risperidone Other (See Comments)    Hyperprolactinemia  . Ativan [Lorazepam]     HALLUCINATIONS   . Azithromycin   . Norco [Hydrocodone-Acetaminophen]   . Penicillins     Has patient had a PCN reaction causing immediate rash,  facial/tongue/throat swelling, SOB or lightheadedness with hypotension: No Has patient had a PCN reaction causing severe rash involving mucus membranes or skin necrosis: No Has patient had a PCN reaction that required hospitalization No Has patient had a PCN reaction occurring within the last 10 years: YES If all of the above answers are "NO", then may proceed with Cephalosporin use.  Randol Kern. Abilify [Aripiprazole] Anxiety    Uncontrollable pacing and shaking.   . Ziprasidone Palpitations    Prescriptions Prior to Admission  Medication Sig Dispense Refill Last Dose  . acetaminophen (TYLENOL) 325 MG tablet Take 650 mg by mouth every 6 (six) hours as needed for mild pain.   05/22/2016 at Unknown time  . aspirin EC 81 MG tablet Take 81 mg by mouth daily.   05/22/2016 at Unknown time  . butalbital-acetaminophen-caffeine (FIORICET, ESGIC) 50-325-40 MG per tablet Take 2 tablets by mouth 2 (two) times daily as needed for headache.    05/23/2016 at Unknown time  . cefdinir (OMNICEF) 300 MG capsule Take 300 mg by mouth daily. STARTED 10 DAY SUPPLY ON 05/19/2016   Past Week at Unknown time  . hydrOXYzine (VISTARIL) 50 MG capsule Take 50 mg by mouth every 4 (four) hours as needed for anxiety.    Past Week at Unknown time  . labetalol (NORMODYNE) 100 MG tablet Take 100 mg by mouth 2 (two) times daily.   05/22/2016 at 2000  . Magnesium 100  MG CAPS Take 100 mg by mouth daily.   05/22/2016 at Unknown time  . Prenatal Vit-Fe Fumarate-FA (PRENATAL VITAMINS) 28-0.8 MG TABS Take 1 tablet by mouth daily.    05/22/2016 at Unknown time    Review of Systems  Constitutional: Negative for chills and fever.  Eyes: Positive for blurred vision, photophobia and pain.  Respiratory: Positive for shortness of breath.   Cardiovascular: Negative for chest pain.  Gastrointestinal: Negative for nausea and vomiting.  Genitourinary: Negative for dysuria.  Musculoskeletal: Negative for myalgias.  Neurological: Positive for  dizziness, sensory change, focal weakness and headaches.  Psychiatric/Behavioral: The patient is nervous/anxious.    Physical Exam   Blood pressure 94/74, pulse 87, temperature 98 F (36.7 C), temperature source Oral, resp. rate 20, SpO2 100 %.  Physical Exam  Constitutional: She appears well-developed and well-nourished.  Eyes: Conjunctivae and EOM are normal. Pupils are equal, round, and reactive to light.  Neurological: She is alert. She has normal strength. No cranial nerve deficit or sensory deficit. She displays a negative Romberg sign. Coordination and gait normal.  Psychiatric: Her mood appears anxious. Her speech is tangential.    MAU Course  Procedures  MDM In MAU patient will not sit still for another exam. She has BP >150/90. She has not taken her labetolol for her cHTN today. When asked to stay for labs she reports "God will heal Her" She was told she would have to sign out AMA and [facility].  Assessment and Plan  1. Elevated with headache: advised labs and PR/CR ratio But patient left against medical advice.  Josue D Santos 05/23/2016, 9:19 AM

## 2016-05-23 NOTE — Progress Notes (Signed)
Patient presents today with sudden headache that concerned her about her baby.  "The [name] is my great physician and there is nothing you can do about it, he will heal me, but I just wanted to check on my baby."  Her heart rate was 152, so that's good."

## 2016-05-23 NOTE — Discharge Instructions (Signed)
Tension Headache A tension headache is pain, pressure, or aching that is felt over the front and sides of your head. These headaches can last from 30 minutes to several days. HOME CARE Managing Pain  Take over-the-counter and prescription medicines only as told by your doctor.  Lie down in a dark, quiet room when you have a headache.  If directed, apply ice to your head and neck area:  Put ice in a plastic bag.  Place a towel between your skin and the bag.  Leave the ice on for 20 minutes, 2-3 times per day.  Use a heating pad or a hot shower to apply heat to your head and neck area as told by your doctor. Eating and Drinking  Eat meals on a regular schedule.  Do not drink a lot of alcohol.  Do not use a lot of caffeine, or stop using caffeine. General Instructions  Keep all follow-up visits as told by your doctor. This is important.  Keep a journal to find out if certain things bring on headaches. For example, write down:  What you eat and drink.  How much sleep you get.  Any change to your diet or medicines.  Try getting a massage, or doing other things that help you to relax.  Lessen stress.  Sit up straight. Do not tighten (tense) your muscles.  Do not use tobacco products. This includes cigarettes, chewing tobacco, or e-cigarettes. If you need help quitting, ask your doctor.  Exercise regularly as told by your doctor.  Get enough sleep. This may mean 7-9 hours of sleep. GET HELP IF:  Your symptoms are not helped by medicine.  You have a headache that feels different from your usual headache.  You feel sick to your stomach (nauseous) or you throw up (vomit).  You have a fever. GET HELP RIGHT AWAY IF:  Your headache becomes very bad.  You keep throwing up.  You have a stiff neck.  You have trouble seeing.  You have trouble speaking.  You have pain in your eye or ear.  Your muscles are weak or you lose muscle control.  You lose your balance  or you have trouble walking.  You feel like you will pass out (faint) or you pass out.  You have confusion.   This information is not intended to replace advice given to you by your health care provider. Make sure you discuss any questions you have with your health care provider.   Document Released: 10/03/2009 Document Revised: 03/30/2015 Document Reviewed: 11/01/2014 Elsevier Interactive Patient Education 2016 Elsevier Inc.  

## 2016-05-23 NOTE — Progress Notes (Signed)
Went over DC paperwork with patient and checked another set of VS prior to DC home with pulse being 104 and BP 156/98 as patient was walking around room talking to RN.  Dr. Ernestina PennaNicholas Schenk notified of latest VS and he spoke with patient stating he would like her to stay in the hospital for further evaluation with that elevated BP.  Patient refused.  "No I'm fine, my dad is waiting for me and I really need to go."  Patient signed out AMA and verbalized understanding that it is recommended that she stay here for further evaluation, but she insists on leaving now.

## 2016-07-19 DIAGNOSIS — O169 Unspecified maternal hypertension, unspecified trimester: Secondary | ICD-10-CM | POA: Insufficient documentation

## 2016-08-02 ENCOUNTER — Encounter: Payer: Self-pay | Admitting: *Deleted

## 2016-08-02 ENCOUNTER — Encounter: Payer: Self-pay | Admitting: Obstetrics and Gynecology

## 2016-08-15 ENCOUNTER — Encounter: Payer: Self-pay | Admitting: Obstetrics and Gynecology

## 2016-08-17 ENCOUNTER — Encounter: Payer: Self-pay | Admitting: Obstetrics & Gynecology

## 2016-08-20 ENCOUNTER — Inpatient Hospital Stay (HOSPITAL_COMMUNITY): Payer: Medicare Other

## 2016-08-20 ENCOUNTER — Inpatient Hospital Stay (HOSPITAL_COMMUNITY)
Admission: AD | Admit: 2016-08-20 | Discharge: 2016-08-20 | Disposition: A | Discharge status: Home / Self Care | Payer: Medicare Other | Source: Ambulatory Visit | Attending: Obstetrics & Gynecology | Admitting: Obstetrics & Gynecology

## 2016-08-20 DIAGNOSIS — Z8673 Personal history of transient ischemic attack (TIA), and cerebral infarction without residual deficits: Secondary | ICD-10-CM | POA: Diagnosis not present

## 2016-08-20 DIAGNOSIS — O99343 Other mental disorders complicating pregnancy, third trimester: Secondary | ICD-10-CM | POA: Diagnosis not present

## 2016-08-20 DIAGNOSIS — F319 Bipolar disorder, unspecified: Secondary | ICD-10-CM | POA: Insufficient documentation

## 2016-08-20 DIAGNOSIS — Z880 Allergy status to penicillin: Secondary | ICD-10-CM | POA: Insufficient documentation

## 2016-08-20 DIAGNOSIS — Z3A35 35 weeks gestation of pregnancy: Secondary | ICD-10-CM | POA: Insufficient documentation

## 2016-08-20 DIAGNOSIS — O288 Other abnormal findings on antenatal screening of mother: Secondary | ICD-10-CM

## 2016-08-20 DIAGNOSIS — Z7982 Long term (current) use of aspirin: Secondary | ICD-10-CM | POA: Insufficient documentation

## 2016-08-20 DIAGNOSIS — O10013 Pre-existing essential hypertension complicating pregnancy, third trimester: Secondary | ICD-10-CM | POA: Diagnosis not present

## 2016-08-20 NOTE — MAU Note (Signed)
Pt presents stating she has been having contractions since 8pm, not timing them. Denies bleeding or leaking. Reports good fetal movement. [facility]. States she ran out of her metoprolol

## 2016-08-20 NOTE — Discharge Instructions (Signed)
Introduction Patient Name: ________________________________________________ Patient Due Date: ____________________ What is a fetal movement count? A fetal movement count is the number of times that you feel your baby move during a certain amount of time. This may also be called a fetal kick count. A fetal movement count is recommended for every pregnant woman. You may be asked to start counting fetal movements as early as week 28 of your pregnancy. Pay attention to when your baby is most active. You may notice your baby's sleep and wake cycles. You may also notice things that make your baby move more. You should do a fetal movement count:  When your baby is normally most active.  At the same time each day. A good time to count movements is while you are resting, after having something to eat and drink. How do I count fetal movements? 1. Find a quiet, comfortable area. Sit, or lie down on your side. 2. Write down the date, the start time and stop time, and the number of movements that you felt between those two times. Take this information with you to your health care visits. 3. For 2 hours, count kicks, flutters, swishes, rolls, and jabs. You should feel at least 10 movements during 2 hours. 4. You may stop counting after you have felt 10 movements. 5. If you do not feel 10 movements in 2 hours, have something to eat and drink. Then, keep resting and counting for 1 hour. If you feel at least 4 movements during that hour, you may stop counting. Contact a health care provider if:  You feel fewer than 4 movements in 2 hours.  Your baby is not moving like he or she usually does. Date: ____________ Start time: ____________ Stop time: ____________ Movements: ____________ Date: ____________ Start time: ____________ Stop time: ____________ Movements: ____________ Date: ____________ Start time: ____________ Stop time: ____________ Movements: ____________ Date: ____________ Start time: ____________  Stop time: ____________ Movements: ____________ Date: ____________ Start time: ____________ Stop time: ____________ Movements: ____________ Date: ____________ Start time: ____________ Stop time: ____________ Movements: ____________ Date: ____________ Start time: ____________ Stop time: ____________ Movements: ____________ Date: ____________ Start time: ____________ Stop time: ____________ Movements: ____________ Date: ____________ Start time: ____________ Stop time: ____________ Movements: ____________ This information is not intended to replace advice given to you by your health care provider. Make sure you discuss any questions you have with your health care provider. Document Released: 08/08/2006 Document Revised: 03/07/2016 Document Reviewed: 08/18/2015 Elsevier Interactive Patient Education  2017 [employer]  

## 2016-08-20 NOTE — MAU Provider Note (Signed)
Obstetric Resident MAU Note  Chief Complaint:  Contractions   None    HPI: Carolyn Wright is a 41 y.o. G4P1021 at [redacted]w[redacted]d who presents to maternity admissions reporting uterine contractions of increasing intensity and frequency. Upon arrival, the patient reported an improvement in the intensity of this cramping and only uterine irritability was detected on the monitor.   Denies contractions, leakage of fluid or vaginal bleeding. Good fetal movement.   Pregnancy Course: Receives care at [facility] History of Bipolar I disorder- reports being on lithium as well as carbatrol Chronic hypertension- she is supposed to be on metoprolol 25mg  daily. BP well-controlled at this time.  Postpartum psychosis with previous pregnancy History of TIAs  Patient Active Problem List   Diagnosis Date Noted  . Manic behavior (HCC)     Past Medical History:  Diagnosis Date  . Anemia   . Bipolar 1 disorder (HCC)   . Depression complicating pregnancy, postpartum    pp psychosis, 2006  . Headache   . Hypertension   . transient ischemic attack 2013    OB History  Gravida Para Term Preterm AB Living  4 1 1  0 2 1  SAB TAB Ectopic Multiple Live Births  2 0 0 0 1    # Outcome Date GA Lbr Len/2nd Weight Sex Delivery Anes PTL Lv  4 Current           3 SAB 06/20/15          2 Term 09/30/04 [redacted]w[redacted]d  6 lb 14 oz (3.118 kg) F Vag-Spont EPI N LIV  1 SAB 2002              Past Surgical History:  Procedure Laterality Date  . APPENDECTOMY      Family History: Family History  Problem Relation Age of Onset  . Diabetes Mother   . Hypertension Mother   . Heart disease Maternal Uncle   . Cancer Maternal Grandfather     lung    Social History: Social History  Substance Use Topics  . Smoking status: Never Smoker  . Smokeless tobacco: Never Used  . Alcohol use No    Allergies:  Allergies  Allergen Reactions  . Depakote [Divalproex Sodium] Palpitations  . Geodon [Ziprasidone Hcl]     CARDIAC ARREST  .  Zyprexa [Olanzapine] Anxiety    Uncontrollable pacing and shaking.   Marland Kitchen Risperidone Other (See Comments)    Hyperprolactinemia  . Ativan [Lorazepam]     HALLUCINATIONS   . Azithromycin   . Norco [Hydrocodone-Acetaminophen]   . Penicillins     Has patient had a PCN reaction causing immediate rash, facial/tongue/throat swelling, SOB or lightheadedness with hypotension: No Has patient had a PCN reaction causing severe rash involving mucus membranes or skin necrosis: No Has patient had a PCN reaction that required hospitalization No Has patient had a PCN reaction occurring within the last 10 years: YES If all of the above answers are "NO", then may proceed with Cephalosporin use.  Randol Kern [Aripiprazole] Anxiety    Uncontrollable pacing and shaking.   . Ziprasidone Palpitations    No prescriptions prior to admission.    ROS: Pertinent findings in history of present illness.  Physical Exam  Blood pressure 121/67, pulse 103, temperature 98 F (36.7 C), temperature source Oral, resp. rate 20. CONSTITUTIONAL: Well-developed, well-nourished female in no acute distress.  HENT:  Normocephalic, atraumatic, moist mucus membranes EYES: Conjunctivae and EOM are normal. No scleral icterus.  NECK: Normal range of motion, supple  SKIN: Skin is warm and dry. No rash noted. Not diaphoretic. No erythema. No pallor. NEUROLGIC: Alert and oriented to person, place, and time. No focal defects. PSYCHIATRIC: Normal mood and affect. Normal behavior. Normal judgment and thought content. CARDIOVASCULAR: Normal heart rate noted, regular rhythm RESPIRATORY: Effort and breath sounds normal, no problems with respiration noted ABDOMEN: Soft, nontender, nondistended, gravid appropriate for gestational age MUSCULOSKELETAL: Normal range of motion. No edema and no tenderness. 2+ distal pulses.  Dilation: Closed Exam by:: Barbarann EhlersM. Campbell, MD; A. Madilyn FiremanHayes, RN  FHT:  Baseline 130s , moderate variability, minimal  accelerations present, no decelerations Contractions: uterine irritability; irregular pattern   Labs: No results found for this or any previous visit (from the past 24 hour(s)).  Imaging:  No results found.  MAU Course: Patient presented initially as a labor evaluation with contractions of increasing frequency and intensity. Her cervical exam revealed a closed cervix. Her FHT was not reactive however. It had good variability, but did not have adequate accelerations. A BPP was performed and returned 8/8. Suspect that patient's underlying medications for mood stabilization are likely responsible. Patient reported a decrease in the frequency and intensity of contractions while here. She reported feeling ready to go home.   Assessment: 1. Non-reactive NST (non-stress test)     Plan: Discharge home Labor precautions reviewed Follow up with OB provider within the next 1-2 weeks.    Allergies as of 08/20/2016      Reactions   Depakote [divalproex Sodium] Palpitations   Geodon [ziprasidone Hcl]    CARDIAC ARREST   Zyprexa [olanzapine] Anxiety   Uncontrollable pacing and shaking.    Risperidone Other (See Comments)   Hyperprolactinemia   Ativan [lorazepam]    HALLUCINATIONS   Azithromycin    Norco [hydrocodone-acetaminophen]    Penicillins    Has patient had a PCN reaction causing immediate rash, facial/tongue/throat swelling, SOB or lightheadedness with hypotension: No Has patient had a PCN reaction causing severe rash involving mucus membranes or skin necrosis: No Has patient had a PCN reaction that required hospitalization No Has patient had a PCN reaction occurring within the last 10 years: YES If all of the above answers are "NO", then may proceed with Cephalosporin use.   Abilify [aripiprazole] Anxiety   Uncontrollable pacing and shaking.    Ziprasidone Palpitations      Medication List    STOP taking these medications   cefdinir 300 MG capsule Commonly known as:   OMNICEF     TAKE these medications   acetaminophen 325 MG tablet Commonly known as:  TYLENOL Take 650 mg by mouth every 6 (six) hours as needed for mild pain.   aspirin EC 81 MG tablet Take 81 mg by mouth daily.   butalbital-acetaminophen-caffeine 50-325-40 MG tablet Commonly known as:  FIORICET, ESGIC Take 2 tablets by mouth 2 (two) times daily as needed for headache.   hydrOXYzine 50 MG capsule Commonly known as:  VISTARIL Take 50 mg by mouth every 4 (four) hours as needed for anxiety.   labetalol 100 MG tablet Commonly known as:  NORMODYNE Take 100 mg by mouth 2 (two) times daily.   Magnesium 100 MG Caps Take 100 mg by mouth daily.   Prenatal Vitamins 28-0.8 MG Tabs Take 1 tablet by mouth daily.       Lise AuerMegan C Campbell, MD PGY-2 08/20/2016 9:09 AM

## 2016-08-22 ENCOUNTER — Encounter: Payer: Self-pay | Admitting: Family Medicine

## 2016-08-27 ENCOUNTER — Telehealth: Payer: Self-pay | Admitting: *Deleted

## 2016-08-27 ENCOUNTER — Other Ambulatory Visit (HOSPITAL_COMMUNITY)
Admission: RE | Admit: 2016-08-27 | Discharge: 2016-08-27 | Disposition: A | Discharge status: Home / Self Care | Payer: Medicare Other | Source: Ambulatory Visit | Attending: Family Medicine | Admitting: Family Medicine

## 2016-08-27 ENCOUNTER — Encounter: Payer: Self-pay | Admitting: Obstetrics & Gynecology

## 2016-08-27 ENCOUNTER — Ambulatory Visit (INDEPENDENT_AMBULATORY_CARE_PROVIDER_SITE_OTHER): Payer: Medicare Other | Admitting: Obstetrics & Gynecology

## 2016-08-27 VITALS — BP 113/66 | HR 91 | Wt 205.2 lb

## 2016-08-27 DIAGNOSIS — Z113 Encounter for screening for infections with a predominantly sexual mode of transmission: Secondary | ICD-10-CM

## 2016-08-27 DIAGNOSIS — O10913 Unspecified pre-existing hypertension complicating pregnancy, third trimester: Secondary | ICD-10-CM | POA: Diagnosis not present

## 2016-08-27 DIAGNOSIS — O09523 Supervision of elderly multigravida, third trimester: Secondary | ICD-10-CM | POA: Diagnosis present

## 2016-08-27 DIAGNOSIS — O0993 Supervision of high risk pregnancy, unspecified, third trimester: Secondary | ICD-10-CM

## 2016-08-27 DIAGNOSIS — O09529 Supervision of elderly multigravida, unspecified trimester: Secondary | ICD-10-CM

## 2016-08-27 LAB — OB RESULTS CONSOLE GC/CHLAMYDIA: GC PROBE AMP, GENITAL: NEGATIVE

## 2016-08-27 LAB — OB RESULTS CONSOLE GBS: GBS: POSITIVE
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
## 2016-08-27 NOTE — Telephone Encounter (Signed)
Scheduled u/s for 2/8 @ 0830. Called patient and notified her of the appointment. Understanding voiced.

## 2016-08-28 LAB — CERVICOVAGINAL ANCILLARY ONLY
CHLAMYDIA, DNA PROBE: NEGATIVE
NEISSERIA GONORRHEA: NEGATIVE

## 2016-08-31 ENCOUNTER — Ambulatory Visit (HOSPITAL_COMMUNITY): Payer: Medicare Other

## 2016-09-02 LAB — CULTURE, STREPTOCOCCUS GRP B W/SUSCEPT

## 2016-09-04 ENCOUNTER — Ambulatory Visit (HOSPITAL_COMMUNITY)
Admission: RE | Admit: 2016-09-04 | Discharge: 2016-09-04 | Disposition: A | Discharge status: Home / Self Care | Payer: Medicare Other | Source: Ambulatory Visit | Attending: Obstetrics & Gynecology | Admitting: Obstetrics & Gynecology

## 2016-09-04 ENCOUNTER — Encounter (HOSPITAL_COMMUNITY): Payer: Self-pay

## 2016-09-04 ENCOUNTER — Other Ambulatory Visit: Payer: Self-pay | Admitting: Obstetrics & Gynecology

## 2016-09-04 DIAGNOSIS — O09523 Supervision of elderly multigravida, third trimester: Secondary | ICD-10-CM

## 2016-09-04 DIAGNOSIS — Z3A37 37 weeks gestation of pregnancy: Secondary | ICD-10-CM

## 2016-09-04 DIAGNOSIS — Z3A35 35 weeks gestation of pregnancy: Secondary | ICD-10-CM

## 2016-09-04 DIAGNOSIS — O0993 Supervision of high risk pregnancy, unspecified, third trimester: Secondary | ICD-10-CM

## 2016-09-04 DIAGNOSIS — Z363 Encounter for antenatal screening for malformations: Secondary | ICD-10-CM | POA: Insufficient documentation

## 2016-09-05 ENCOUNTER — Inpatient Hospital Stay (HOSPITAL_COMMUNITY): Payer: Medicare Other | Admitting: Anesthesiology

## 2016-09-05 ENCOUNTER — Encounter (HOSPITAL_COMMUNITY): Payer: Self-pay

## 2016-09-05 ENCOUNTER — Other Ambulatory Visit: Payer: Self-pay | Admitting: Obstetrics & Gynecology

## 2016-09-05 ENCOUNTER — Ambulatory Visit (HOSPITAL_COMMUNITY)
Admission: RE | Admit: 2016-09-05 | Discharge: 2016-09-05 | Disposition: A | Discharge status: Home / Self Care | Payer: Medicare Other | Source: Ambulatory Visit | Attending: Obstetrics & Gynecology | Admitting: Obstetrics & Gynecology

## 2016-09-05 ENCOUNTER — Inpatient Hospital Stay (HOSPITAL_COMMUNITY)
Admission: AD | Admit: 2016-09-05 | Discharge: 2016-09-09 | DRG: 766 | Disposition: A | Discharge status: Home / Self Care | Payer: Medicare Other | Source: Ambulatory Visit | Attending: Obstetrics and Gynecology | Admitting: Obstetrics and Gynecology

## 2016-09-05 ENCOUNTER — Ambulatory Visit (INDEPENDENT_AMBULATORY_CARE_PROVIDER_SITE_OTHER): Payer: Medicare Other | Admitting: Obstetrics & Gynecology

## 2016-09-05 ENCOUNTER — Encounter: Payer: Self-pay | Admitting: Obstetrics & Gynecology

## 2016-09-05 VITALS — BP 112/62 | HR 94 | Wt 207.7 lb

## 2016-09-05 DIAGNOSIS — O169 Unspecified maternal hypertension, unspecified trimester: Secondary | ICD-10-CM

## 2016-09-05 DIAGNOSIS — O288 Other abnormal findings on antenatal screening of mother: Secondary | ICD-10-CM

## 2016-09-05 DIAGNOSIS — O163 Unspecified maternal hypertension, third trimester: Secondary | ICD-10-CM | POA: Diagnosis present

## 2016-09-05 DIAGNOSIS — O10013 Pre-existing essential hypertension complicating pregnancy, third trimester: Secondary | ICD-10-CM | POA: Insufficient documentation

## 2016-09-05 DIAGNOSIS — F319 Bipolar disorder, unspecified: Secondary | ICD-10-CM | POA: Diagnosis present

## 2016-09-05 DIAGNOSIS — O1092 Unspecified pre-existing hypertension complicating childbirth: Secondary | ICD-10-CM | POA: Diagnosis not present

## 2016-09-05 DIAGNOSIS — O09529 Supervision of elderly multigravida, unspecified trimester: Secondary | ICD-10-CM

## 2016-09-05 DIAGNOSIS — O99344 Other mental disorders complicating childbirth: Secondary | ICD-10-CM | POA: Diagnosis present

## 2016-09-05 DIAGNOSIS — Z3A37 37 weeks gestation of pregnancy: Secondary | ICD-10-CM

## 2016-09-05 DIAGNOSIS — O1002 Pre-existing essential hypertension complicating childbirth: Principal | ICD-10-CM | POA: Diagnosis present

## 2016-09-05 DIAGNOSIS — Z8673 Personal history of transient ischemic attack (TIA), and cerebral infarction without residual deficits: Secondary | ICD-10-CM | POA: Diagnosis not present

## 2016-09-05 DIAGNOSIS — Z823 Family history of stroke: Secondary | ICD-10-CM

## 2016-09-05 DIAGNOSIS — Z8249 Family history of ischemic heart disease and other diseases of the circulatory system: Secondary | ICD-10-CM | POA: Diagnosis not present

## 2016-09-05 DIAGNOSIS — O09523 Supervision of elderly multigravida, third trimester: Secondary | ICD-10-CM | POA: Insufficient documentation

## 2016-09-05 DIAGNOSIS — F411 Generalized anxiety disorder: Secondary | ICD-10-CM | POA: Diagnosis present

## 2016-09-05 DIAGNOSIS — O0993 Supervision of high risk pregnancy, unspecified, third trimester: Secondary | ICD-10-CM

## 2016-09-05 DIAGNOSIS — Z833 Family history of diabetes mellitus: Secondary | ICD-10-CM

## 2016-09-05 DIAGNOSIS — O99824 Streptococcus B carrier state complicating childbirth: Secondary | ICD-10-CM | POA: Diagnosis present

## 2016-09-05 DIAGNOSIS — O0990 Supervision of high risk pregnancy, unspecified, unspecified trimester: Secondary | ICD-10-CM

## 2016-09-05 DIAGNOSIS — O283 Abnormal ultrasonic finding on antenatal screening of mother: Secondary | ICD-10-CM | POA: Insufficient documentation

## 2016-09-05 LAB — DIFFERENTIAL
BASOS PCT: 0 %
Basophils Absolute: 0.1 10*3/uL (ref 0.0–0.1)
EOS ABS: 0.6 10*3/uL (ref 0.0–0.7)
EOS PCT: 5 %
LYMPHS ABS: 1.4 10*3/uL (ref 0.7–4.0)
Lymphocytes Relative: 10 %
Monocytes Absolute: 0.9 10*3/uL (ref 0.1–1.0)
Monocytes Relative: 7 %
NEUTROS PCT: 79 %
Neutro Abs: 11 10*3/uL — ABNORMAL HIGH (ref 1.7–7.7)

## 2016-09-05 LAB — CBC
HCT: 33 % — ABNORMAL LOW (ref 36.0–46.0)
HEMOGLOBIN: 11 g/dL — AB (ref 12.0–15.0)
MCH: 30.8 pg (ref 26.0–34.0)
MCHC: 33.3 g/dL (ref 30.0–36.0)
MCV: 92.4 fL (ref 78.0–100.0)
Platelets: 329 10*3/uL (ref 150–400)
RBC: 3.57 MIL/uL — AB (ref 3.87–5.11)
RDW: 17.4 % — ABNORMAL HIGH (ref 11.5–15.5)
WBC: 14.3 10*3/uL — ABNORMAL HIGH (ref 4.0–10.5)

## 2016-09-05 LAB — TYPE AND SCREEN
ABO/RH(D): O POS
ANTIBODY SCREEN: NEGATIVE

## 2016-09-05 LAB — ABO/RH: ABO/RH(D): O POS

## 2016-09-05 MED ORDER — METOPROLOL TARTRATE 25 MG PO TABS
25.0000 mg | ORAL_TABLET | Freq: Two times a day (BID) | ORAL | Status: DC
  Administered 2016-09-05: 25 mg via ORAL
  Filled 2016-09-05 (×2): qty 1

## 2016-09-05 MED ORDER — LACTATED RINGERS IV SOLN: 500.0000 mL | Freq: Once | INTRAVENOUS | Status: DC

## 2016-09-05 MED ORDER — LACTATED RINGERS IV SOLN
500.0000 mL | Freq: Once | INTRAVENOUS | Status: AC
  Administered 2016-09-05: 500 mL via INTRAVENOUS

## 2016-09-05 MED ORDER — MAGNESIUM OXIDE 400 (241.3 MG) MG PO TABS
200.0000 mg | ORAL_TABLET | Freq: Every day | ORAL | Status: DC
  Filled 2016-09-05 (×2): qty 0.5

## 2016-09-05 MED ORDER — MAGNESIUM 100 MG PO CAPS: 100.0000 mg | ORAL_CAPSULE | Freq: Every day | ORAL | Status: DC

## 2016-09-05 MED ORDER — PHENYLEPHRINE 40 MCG/ML (10ML) SYRINGE FOR IV PUSH (FOR BLOOD PRESSURE SUPPORT): 80.0000 ug | PREFILLED_SYRINGE | INTRAVENOUS | Status: DC | PRN

## 2016-09-05 MED ORDER — EPHEDRINE 5 MG/ML INJ: 10.0000 mg | INTRAVENOUS | Status: DC | PRN

## 2016-09-05 MED ORDER — TERBUTALINE SULFATE 1 MG/ML IJ SOLN
0.2500 mg | Freq: Once | INTRAMUSCULAR | Status: AC | PRN
  Administered 2016-09-06: 0.25 mg via SUBCUTANEOUS
  Filled 2016-09-05: qty 1

## 2016-09-05 MED ORDER — OXYTOCIN 40 UNITS IN LACTATED RINGERS INFUSION - SIMPLE MED: 2.5000 [IU]/h | INTRAVENOUS | Status: DC

## 2016-09-05 MED ORDER — LACTATED RINGERS IV SOLN
INTRAVENOUS | Status: DC
  Administered 2016-09-05 – 2016-09-06 (×5): via INTRAVENOUS

## 2016-09-05 MED ORDER — LITHIUM CARBONATE 300 MG PO CAPS
300.0000 mg | ORAL_CAPSULE | Freq: Every day | ORAL | Status: DC
  Filled 2016-09-05: qty 1

## 2016-09-05 MED ORDER — HYDROXYZINE PAMOATE 50 MG PO CAPS
50.0000 mg | ORAL_CAPSULE | ORAL | Status: DC | PRN
  Filled 2016-09-05: qty 1

## 2016-09-05 MED ORDER — OXYTOCIN BOLUS FROM INFUSION: 500.0000 mL | Freq: Once | INTRAVENOUS | Status: DC

## 2016-09-05 MED ORDER — LITHIUM CARBONATE 300 MG PO CAPS
600.0000 mg | ORAL_CAPSULE | Freq: Every day | ORAL | Status: DC
  Administered 2016-09-05: 600 mg via ORAL
  Filled 2016-09-05: qty 2

## 2016-09-05 MED ORDER — LIDOCAINE HCL (PF) 1 % IJ SOLN: 30.0000 mL | INTRAMUSCULAR | Status: DC | PRN

## 2016-09-05 MED ORDER — LITHIUM CITRATE 300 MG/5 ML PO SYRP: 600.0000 mg | Freq: Every day | ORAL | Status: DC

## 2016-09-05 MED ORDER — CLINDAMYCIN PHOSPHATE 900 MG/50ML IV SOLN
900.0000 mg | Freq: Three times a day (TID) | INTRAVENOUS | Status: DC
  Administered 2016-09-05 – 2016-09-06 (×3): 900 mg via INTRAVENOUS
  Filled 2016-09-05 (×3): qty 50

## 2016-09-05 MED ORDER — PHENYLEPHRINE 40 MCG/ML (10ML) SYRINGE FOR IV PUSH (FOR BLOOD PRESSURE SUPPORT)
80.0000 ug | PREFILLED_SYRINGE | INTRAVENOUS | Status: DC | PRN
  Filled 2016-09-05: qty 10

## 2016-09-05 MED ORDER — ACETAMINOPHEN 325 MG PO TABS: 650.0000 mg | ORAL_TABLET | ORAL | Status: DC | PRN

## 2016-09-05 MED ORDER — ONDANSETRON HCL 4 MG/2ML IJ SOLN: 4.0000 mg | Freq: Four times a day (QID) | INTRAMUSCULAR | Status: DC | PRN

## 2016-09-05 MED ORDER — DIPHENHYDRAMINE HCL 50 MG/ML IJ SOLN: 12.5000 mg | INTRAMUSCULAR | Status: DC | PRN

## 2016-09-05 MED ORDER — OXYTOCIN 40 UNITS IN LACTATED RINGERS INFUSION - SIMPLE MED
1.0000 m[IU]/min | INTRAVENOUS | Status: DC
  Administered 2016-09-05: 1 m[IU]/min via INTRAVENOUS
  Filled 2016-09-05: qty 1000

## 2016-09-05 MED ORDER — LITHIUM CITRATE 300 MG/5 ML PO SYRP: 300.0000 mg | Freq: Every day | ORAL | Status: DC

## 2016-09-05 MED ORDER — FENTANYL 2.5 MCG/ML BUPIVACAINE 1/10 % EPIDURAL INFUSION (WH - ANES)
14.0000 mL/h | INTRAMUSCULAR | Status: DC | PRN
  Administered 2016-09-05: 14 mL/h via EPIDURAL
  Filled 2016-09-05: qty 100

## 2016-09-05 MED ORDER — LACTATED RINGERS IV SOLN: 500.0000 mL | INTRAVENOUS | Status: DC | PRN

## 2016-09-05 MED ORDER — MAGNESIUM OXIDE 400 (241.3 MG) MG PO TABS: 100.0000 mg | ORAL_TABLET | Freq: Every day | ORAL | Status: DC

## 2016-09-05 MED ORDER — LIDOCAINE HCL (PF) 1 % IJ SOLN
INTRAMUSCULAR | Status: DC | PRN
  Administered 2016-09-05: 5 mL via EPIDURAL

## 2016-09-05 MED ORDER — MAGNESIUM 200 MG PO TABS
200.0000 mg | ORAL_TABLET | Freq: Every day | ORAL | Status: DC
  Filled 2016-09-05 (×2): qty 1

## 2016-09-05 MED ORDER — CLOZAPINE 100 MG PO TABS
200.0000 mg | ORAL_TABLET | Freq: Every day | ORAL | Status: DC
  Administered 2016-09-05: 200 mg via ORAL
  Filled 2016-09-05: qty 2

## 2016-09-05 MED ORDER — FENTANYL 2.5 MCG/ML BUPIVACAINE 1/10 % EPIDURAL INFUSION (WH - ANES): 14.0000 mL/h | INTRAMUSCULAR | Status: DC | PRN

## 2016-09-05 MED ORDER — SOD CITRATE-CITRIC ACID 500-334 MG/5ML PO SOLN
30.0000 mL | ORAL | Status: DC | PRN
  Administered 2016-09-05 – 2016-09-06 (×3): 30 mL via ORAL
  Filled 2016-09-05 (×3): qty 15

## 2016-09-05 MED ORDER — MAGNESIUM 200 MG PO TABS
100.0000 mg | ORAL_TABLET | Freq: Every day | ORAL | Status: DC
  Filled 2016-09-05: qty 1

## 2016-09-05 NOTE — Anesthesia Pain Management Evaluation Note (Signed)
  [facility] Pain Management Visit Note  Patient: Carolyn Wright, 41 y.o., female  "Hello I am a member of the anesthesia team at [facility]. We have an anesthesia team available at all times to provide care throughout the hospital, including epidural management and anesthesia for C-section. I don't know your plan for the delivery whether it a natural birth, water birth, IV sedation, nitrous supplementation, doula or epidural, but we want to meet your pain goals."   1.Was your pain managed to your expectations on prior hospitalizations?   Yes   2.What is your expectation for pain management during this hospitalization?     Epidural  3.How can we help you reach that goal?   Record the patient's initial score and the patient's pain goal.   Pain: 4  Pain Goal: 5 [facility] wants you to be able to say your pain was always managed very well.  Laban EmperorMalinova,Angeletta Goelz Hristova 09/05/2016

## 2016-09-05 NOTE — Progress Notes (Signed)
US yesterday reviewed, >90 % ile   PRENATAL VISIT NOTE  Subjective:  Carolyn Wright is a 41 y.o. G4P1021 at 7242w2d being seen today for ongoing prenatal care.  She is currently monitored for the following issues for this high-risk pregnancy and has Manic behavior (HCC); Hypertension affecting pregnancy, antepartum; History of postpartum psychosis; Generalized anxiety disorder; Antepartum multigravida of advanced maternal age; Bipolar affective disorder, manic, severe, with psychotic behavior (HCC); and Supervision of high-risk pregnancy on her problem list.  Patient reports no complaints.  Contractions: Not present. Vag. Bleeding: None.  Movement: Present. Denies leaking of fluid.   The following portions of the patient's history were reviewed and updated as appropriate: allergies, current medications, past family history, past medical history, past social history, past surgical history and problem list. Problem list updated.  Objective:   Vitals:   09/05/16 1044  BP: 112/62  Pulse: 94  Weight: 207 lb 11.2 oz (94.2 kg)    Fetal Status: Fetal Heart Rate (bpm): NST Fundal Height: 39 cm Movement: Present     General:  Alert, oriented and cooperative. Patient is in no acute distress.  Skin: Skin is warm and dry. No rash noted.   Cardiovascular: Normal heart rate noted  Respiratory: Normal respiratory effort, no problems with respiration noted  Abdomen: Soft, gravid, appropriate for gestational age. Pain/Pressure: Present     Pelvic:  Cervical exam deferred        Extremities: Normal range of motion.  Edema: Trace  Mental Status: Normal mood and affect. Normal behavior. Normal judgment and thought content.   Assessment and Plan:  Pregnancy: G4P1021 at 5642w2d  1. Hypertension affecting pregnancy, antepartum Normal BP, IOL 39 weeks  2. Antepartum multigravida of advanced maternal age Growth US reviewed  3. Supervision of high risk pregnancy, antepartum NST today  Term labor symptoms  and general obstetric precautions including but not limited to vaginal bleeding, contractions, leaking of fluid and fetal movement were reviewed in detail with the patient. Please refer to After Visit Summary for other counseling recommendations.  Return in about 1 week (around 09/12/2016) for needs NST 2/week.   Adam PhenixJames G Mela Perham, MD

## 2016-09-05 NOTE — H&P (Signed)
LABOR AND DELIVERY ADMISSION HISTORY AND PHYSICAL NOTE  Carolyn Wright is a 41 y.o. female [phone] with IUP at [redacted]w[redacted]d by early [place] presenting for IOL for 4/10 BPP. Pateint has history of Bipolar disorder and has been maintained on Lithium and several other medications through her pregnancy. She also has cHTN but BP has been controlled. She has been getting BPP for cHTN. She was found to have 4/8 BPP and a non-reactive NST.   She reports positive fetal movement. She denies leakage of fluid or vaginal bleeding.  Prenatal History/Complications:  Past Medical History: Past Medical History:  Diagnosis Date  . Anemia   . Bipolar 1 disorder (HCC)   . Complication of anesthesia    hard to wake up, breathing problems  . Depression complicating pregnancy, postpartum    pp psychosis, 2006  . Headache   . Hypertension   . transient ischemic attack 2013    Past Surgical History: Past Surgical History:  Procedure Laterality Date  . APPENDECTOMY    . MOUTH SURGERY      Obstetrical History: OB History    Gravida Para Term Preterm AB Living   4 1 1  0 2 1   SAB TAB Ectopic Multiple Live Births   2 0 0 0 1      Social History: Social History   Social History  . Marital status: Married    Spouse name: N/A  . Number of children: N/A  . Years of education: N/A   Social History Main Topics  . Smoking status: Never Smoker  . Smokeless tobacco: Never Used  . Alcohol use No  . Drug use: No  . Sexual activity: Not Currently    Birth control/ protection: None   Other Topics Concern  . None   Social History Narrative  . None    Family History: Family History  Problem Relation Age of Onset  . Diabetes Mother   . Hypertension Mother   . Heart disease Maternal Uncle   . Stroke Maternal Uncle   . Cancer Maternal Grandfather     lung    Allergies: Allergies  Allergen Reactions  . Depakote [Divalproex Sodium] Palpitations  . Geodon [Ziprasidone Hcl]     CARDIAC ARREST  .  Zyprexa [Olanzapine] Anxiety    Uncontrollable pacing and shaking.   Marland Kitchen Risperidone Other (See Comments)    Hyperprolactinemia  . Ativan [Lorazepam]     HALLUCINATIONS   . Azithromycin   . Norco [Hydrocodone-Acetaminophen]   . Penicillins     Has patient had a PCN reaction causing immediate rash, facial/tongue/throat swelling, SOB or lightheadedness with hypotension: No Has patient had a PCN reaction causing severe rash involving mucus membranes or skin necrosis: No Has patient had a PCN reaction that required hospitalization No Has patient had a PCN reaction occurring within the last 10 years: YES If all of the above answers are "NO", then may proceed with Cephalosporin use.  Randol Kern [Aripiprazole] Anxiety    Uncontrollable pacing and shaking.   . Ziprasidone Palpitations    Prescriptions Prior to Admission  Medication Sig Dispense Refill Last Dose  . acetaminophen (TYLENOL) 325 MG tablet Take 650 mg by mouth every 6 (six) hours as needed for mild pain.   Past Month at Unknown time  . aspirin EC 81 MG tablet Take 81 mg by mouth daily.   09/05/2016 at Unknown time  . butalbital-acetaminophen-caffeine (FIORICET, ESGIC) 50-325-40 MG per tablet Take 2 tablets by mouth 2 (two) times daily as needed  for headache.    Past Month at Unknown time  . clozapine (CLOZARIL) 200 MG tablet Take 200 mg by mouth at bedtime.    09/04/2016 at Unknown time  . ferrous sulfate 325 (65 FE) MG tablet Take 325 mg by mouth at bedtime.   09/04/2016 at Unknown time  . hydrOXYzine (VISTARIL) 50 MG capsule Take 50 mg by mouth every 4 (four) hours as needed for anxiety.    Past Month at Unknown time  . LITHIUM PO Take 5 mLs by mouth daily with breakfast.   09/05/2016 at Unknown time  . LITHIUM PO Take 10 mLs by mouth at bedtime.    09/04/2016 at Unknown time  . Magnesium 250 MG TABS Take 62.5 mg by mouth daily.   09/05/2016 at Unknown time  . metoprolol tartrate (LOPRESSOR) 25 MG tablet Take 25 mg by mouth 2 (two) times  daily.   09/05/2016 at 0800  . Prenatal Vit-Fe Fumarate-FA (PRENATAL VITAMINS) 28-0.8 MG TABS Take 1 tablet by mouth daily.    09/05/2016 at Unknown time  . riboflavin (VITAMIN B-2) 100 MG TABS tablet Take 100 mg by mouth daily.    09/04/2016 at Unknown time     Review of Systems   All systems reviewed and negative except as stated in HPI  Blood pressure 102/71, pulse 100, temperature 98.2 F (36.8 C), temperature source Oral, resp. rate 17, height 5\' 2"  (1.575 m), weight 207 lb 11.2 oz (94.2 kg). General appearance: alert, cooperative and appears stated age Lungs: clear to auscultation bilaterally Heart: regular rate and rhythm Abdomen: soft, non-tender; bowel sounds normal Extremities: No calf swelling or tenderness Presentation: cephalic by my exam Fetal monitoring: category 2 tracing Uterine activity: no contractions Dilation: 1.5 Effacement (%): Thick Station: Ballotable Exam by:: Dr. Genevie Ann   Prenatal labs: ABO, Rh: --/--/O POS (02/14 1348) Antibody: NEG (02/14 1348) Rubella: immune RPR: Nonreactive (07/31 0000)  HBsAg: Negative (07/31 0000)  HIV: Non-reactive (07/31 0000)  GBS: Positive (02/05 0000)  1 hr Glucola: normal Anatomy US: normal  Prenatal Transfer Tool  Maternal Diabetes: No Genetic Screening: Declined Maternal Ultrasounds/Referrals: Normal Fetal Ultrasounds or other Referrals:  None Maternal Substance Abuse:  No Significant Maternal Medications:  Meds include: Other: lithium, clozaril Significant Maternal Lab Results: Lab values include: Group B Strep positive  Results for orders placed or performed during the hospital encounter of 09/05/16 (from the past 24 hour(s))  CBC   Collection Time: 09/05/16  1:48 PM  Result Value Ref Range   WBC 14.3 (H) 4.0 - 10.5 K/uL   RBC 3.57 (L) 3.87 - 5.11 MIL/uL   Hemoglobin 11.0 (L) 12.0 - 15.0 g/dL   HCT 16.1 (L) 09.6 - 04.5 %   MCV 92.4 78.0 - 100.0 fL   MCH 30.8 26.0 - 34.0 pg   MCHC 33.3 30.0 - 36.0 g/dL    RDW 40.9 (H) 81.1 - 15.5 %   Platelets 329 150 - 400 K/uL  Differential   Collection Time: 09/05/16  1:48 PM  Result Value Ref Range   Neutrophils Relative % 79 %   Neutro Abs 11.0 (H) 1.7 - 7.7 K/uL   Lymphocytes Relative 10 %   Lymphs Abs 1.4 0.7 - 4.0 K/uL   Monocytes Relative 7 %   Monocytes Absolute 0.9 0.1 - 1.0 K/uL   Eosinophils Relative 5 %   Eosinophils Absolute 0.6 0.0 - 0.7 K/uL   Basophils Relative 0 %   Basophils Absolute 0.1 0.0 - 0.1 K/uL  Type  and screen [facility]   Collection Time: 09/05/16  1:48 PM  Result Value Ref Range   ABO/RH(D) O POS    Antibody Screen NEG    Sample Expiration 09/08/2016     Patient Active Problem List   Diagnosis Date Noted  . Supervision of high-risk pregnancy 09/05/2016  . NST (non-stress test) nonreactive 09/05/2016  . Hypertension affecting pregnancy, antepartum 07/19/2016  . Manic behavior (HCC)   . Bipolar affective disorder, manic, severe, with psychotic behavior (HCC) 05/11/2016  . Generalized anxiety disorder 05/09/2016  . Antepartum multigravida of advanced maternal age 45/03/2016  . History of postpartum psychosis 02/20/2016    Assessment: Carolyn Wright is a 41 y.o. G4P1021 at 7015w2d here for IOl for 4/10 BPP  #Labor:Foley bulb placed through finger tip cervix. Low dose pitocin started #Pain: Plans for epidural, IV pain medication #FWB: Category 2 decreased varriability #ID:  GBS positive on Clindamycin alegeric to PCN #MOF: bottle #MOC:undecided #Circ:  N/A #cHTN: not on meds controlled.  #Bipolar history: continue meds as reported by patient.   Ernestina Pennaicholas Airen Dales 09/05/2016, 5:47 PM

## 2016-09-05 NOTE — Progress Notes (Signed)
Dr. Debroah LoopArnold informed of NR NST - BPP today.

## 2016-09-05 NOTE — Anesthesia Preprocedure Evaluation (Signed)
Anesthesia Evaluation  Patient identified by MRN, date of birth, ID band Patient awake    Reviewed: Allergy & Precautions, H&P , NPO status , Patient's Chart, lab work & pertinent test results  Airway Mallampati: II   Neck ROM: full    Dental   Pulmonary neg pulmonary ROS,    breath sounds clear to auscultation       Cardiovascular hypertension,  Rhythm:regular Rate:Normal     Neuro/Psych  Headaches, PSYCHIATRIC DISORDERS Depression Bipolar Disorder TIA   GI/Hepatic   Endo/Other    Renal/GU      Musculoskeletal   Abdominal   Peds  Hematology  (+) anemia ,   Anesthesia Other Findings   Reproductive/Obstetrics                             Anesthesia Physical Anesthesia Plan  ASA: II  Anesthesia Plan: Epidural   Post-op Pain Management:    Induction: Intravenous  Airway Management Planned: Natural Airway  Additional Equipment:   Intra-op Plan:   Post-operative Plan:   Informed Consent: I have reviewed the patients History and Physical, chart, labs and discussed the procedure including the risks, benefits and alternatives for the proposed anesthesia with the patient or authorized representative who has indicated his/her understanding and acceptance.     Plan Discussed with: CRNA, Anesthesiologist and Surgeon  Anesthesia Plan Comments:         Anesthesia Quick Evaluation

## 2016-09-05 NOTE — Progress Notes (Signed)
Patient ID: Carolyn Wright, female   DOB: 03-14-1976, 41 y.o.   MRN: [id number]  Foley bulb now out; pt comfortable w/ epidural  BP 117/65, P 103 FHR 130s, min variability, occ variables Ctx q 3 mins w/ Pit @ 607mu/min Cx very anterior, ext os 1+/50/-3  IUP@37 .2wks Nonreactive fetal status Cx unfavorable  Will have Dr Emelda FearFerguson come and replace foley since her cx exam is so difficult; hesitant to give cytotec for cx ripening due to fetal status  Cam HaiSHAW, KIMBERLY CNM 09/05/2016 9:42 PM

## 2016-09-05 NOTE — Progress Notes (Signed)
MEDICATION RELATED CONSULT NOTE  Pharmacy Consult for clozapine REMS program   Allergies  Allergen Reactions  . Depakote [Divalproex Sodium] Palpitations  . Geodon [Ziprasidone Hcl]     CARDIAC ARREST  . Zyprexa [Olanzapine] Anxiety    Uncontrollable pacing and shaking.   Marland Kitchen Risperidone Other (See Comments)    Hyperprolactinemia  . Ativan [Lorazepam]     HALLUCINATIONS   . Azithromycin   . Norco [Hydrocodone-Acetaminophen]   . Penicillins     Has patient had a PCN reaction causing immediate rash, facial/tongue/throat swelling, SOB or lightheadedness with hypotension: No Has patient had a PCN reaction causing severe rash involving mucus membranes or skin necrosis: No Has patient had a PCN reaction that required hospitalization No Has patient had a PCN reaction occurring within the last 10 years: YES If all of the above answers are "NO", then may proceed with Cephalosporin use.  Randol Kern [Aripiprazole] Anxiety    Uncontrollable pacing and shaking.   . Ziprasidone Palpitations    Patient Measurements: Height: 5\' 2"  (157.5 cm) Weight: 207 lb 11.2 oz (94.2 kg) IBW/kg (Calculated) : 50.1   Vital Signs: Temp: 97.9 F (36.6 C) (02/14 2132) Temp Source: Oral (02/14 2132) BP: 117/65 (02/14 2132) Pulse Rate: 103 (02/14 2132) Intake/Output from previous day: No intake/output data recorded. Intake/Output from this shift: No intake/output data recorded.  Labs:  Recent Labs  09/05/16 1348  WBC 14.3*  HGB 11.0*  HCT 33.0*  PLT 329   CrCl cannot be calculated (Patient's most recent lab result is older than the maximum 21 days allowed.).   Microbiology: Recent Results (from the past 720 hour(s))  OB RESULT CONSOLE Group B Strep     Status: None   Collection Time: 08/27/16 12:00 AM  Result Value Ref Range Status   GBS Positive  Final  Culture, Grp B Strep w/Rflx Suscept     Status: None   Collection Time: 08/27/16  2:58 PM  Result Value Ref Range Status   Culture  GROUP B STREP (S.AGALACTIAE) ISOLATED  Final    Comment: SOURCE: GENITAL&GENITAL VARE&VAGINAL/RECTAL   Organism ID, Bacteria GROUP B STREP (S.AGALACTIAE) ISOLATED  Final    Comment: This organism DOES NOT demonstrate inducible Clindamycin resistance in vitro.       Susceptibility   Group b strep (s.agalactiae) isolated -  (no method available)    PENICILLIN <=0.06 Sensitive     AMPICILLIN <=0.25 Sensitive     LEVOFLOXACIN 0.5 Sensitive     VANCOMYCIN 0.5 Sensitive     CLINDAMYCIN <=0.25 Sensitive     Medical History: Past Medical History:  Diagnosis Date  . Anemia   . Bipolar 1 disorder (HCC)   . Complication of anesthesia    hard to wake up, breathing problems  . Depression complicating pregnancy, postpartum    pp psychosis, 2006  . Headache   . Hypertension   . transient ischemic attack 2013    Medications:  Scheduled:  . clindamycin (CLEOCIN) IV  900 mg Intravenous Q8H  . clozapine  200 mg Oral QHS  . lactated ringers  500 mL Intravenous Once  . [START ON 09/06/2016] lithium carbonate  300 mg Oral Q breakfast  . lithium carbonate  600 mg Oral QHS  . [START ON 09/06/2016] Magnesium  100 mg Oral Daily  . metoprolol tartrate  25 mg Oral BID  . oxytocin 40 units in LR 1000 mL  500 mL Intravenous Once    Assessment: 41 yo F admitted for labor.  On clozapine PTA.  Verified eligibility in Clozpine REMS program.  At this point, needs weekly ANC.  Last one 2/6 was ok at 12442 per REMS program.  ANC drawn today reported at 11000 and entered into REMS program.  Eligible to receive clozapine per REMS program.  Goal of Therapy:  Appropriate Clozapine dosing  Plan:  - Continue home clozapine  - Check weekly ANC if remains inpatient for 7 days.  Drusilla KannerGrimsley, Keiandre Cygan Lydia 09/05/2016,10:20 PM

## 2016-09-05 NOTE — ED Notes (Signed)
Edger HouseL. Thuan Tippett, RN escorted pt to MAU for further monitoring of pt.  C. Stalter Ultrasound to parking lot to update pt's father due to pt unable to contact him.  Report given directly to G. Morris,RN to have pt placed on EFM.

## 2016-09-05 NOTE — Anesthesia Procedure Notes (Signed)
Epidural Patient location during procedure: OB Start time: 09/05/2016 7:57 PM End time: 09/05/2016 8:04 PM  Staffing Anesthesiologist: Chaney MallingHODIERNE, Lonzie Simmer Performed: anesthesiologist   Preanesthetic Checklist Completed: patient identified, site marked, surgical consent, pre-op evaluation, timeout performed, IV checked, risks and benefits discussed and monitors and equipment checked  Epidural Patient position: sitting Prep: DuraPrep Patient monitoring: heart rate, cardiac monitor, continuous pulse ox and blood pressure Approach: midline Location: L2-L3 Injection technique: LOR saline  Needle:  Needle type: Tuohy  Needle gauge: 17 G Needle length: 9 cm Needle insertion depth: 5 cm Catheter type: closed end flexible Catheter size: 19 Gauge Catheter at skin depth: 10 cm Test dose: negative and Other  Assessment Events: blood not aspirated, injection not painful, no injection resistance and negative IV test  Additional Notes Informed consent obtained prior to proceeding including risk of failure, 1% risk of PDPH, risk of minor discomfort and bruising.  Discussed rare but serious complications including epidural abscess, permanent nerve injury, epidural hematoma.  Discussed alternatives to epidural analgesia and patient desires to proceed.  Timeout performed pre-procedure verifying patient name, procedure, and platelet count.  Patient tolerated procedure well. Reason for block:procedure for pain

## 2016-09-06 ENCOUNTER — Encounter (HOSPITAL_COMMUNITY)
Admission: AD | Disposition: A | Discharge status: Home / Self Care | Payer: Self-pay | Source: Ambulatory Visit | Attending: Obstetrics and Gynecology

## 2016-09-06 ENCOUNTER — Encounter (HOSPITAL_COMMUNITY): Payer: Self-pay | Admitting: Anesthesiology

## 2016-09-06 ENCOUNTER — Encounter: Payer: Self-pay | Admitting: Student

## 2016-09-06 DIAGNOSIS — Z3A37 37 weeks gestation of pregnancy: Secondary | ICD-10-CM

## 2016-09-06 DIAGNOSIS — O99344 Other mental disorders complicating childbirth: Secondary | ICD-10-CM

## 2016-09-06 DIAGNOSIS — O99824 Streptococcus B carrier state complicating childbirth: Secondary | ICD-10-CM

## 2016-09-06 DIAGNOSIS — O1092 Unspecified pre-existing hypertension complicating childbirth: Secondary | ICD-10-CM

## 2016-09-06 LAB — CBC
HCT: 27.1 % — ABNORMAL LOW (ref 36.0–46.0)
HEMOGLOBIN: 9.2 g/dL — AB (ref 12.0–15.0)
MCH: 30.8 pg (ref 26.0–34.0)
MCHC: 33.9 g/dL (ref 30.0–36.0)
MCV: 90.6 fL (ref 78.0–100.0)
PLATELETS: 261 10*3/uL (ref 150–400)
RBC: 2.99 MIL/uL — ABNORMAL LOW (ref 3.87–5.11)
RDW: 17.4 % — AB (ref 11.5–15.5)
WBC: 18.7 10*3/uL — ABNORMAL HIGH (ref 4.0–10.5)

## 2016-09-06 LAB — RPR: RPR: NONREACTIVE

## 2016-09-06 SURGERY — Surgical Case
Anesthesia: Epidural

## 2016-09-06 MED ORDER — NALBUPHINE HCL 10 MG/ML IJ SOLN: 5.0000 mg | INTRAMUSCULAR | Status: DC | PRN

## 2016-09-06 MED ORDER — LACTATED RINGERS IV SOLN
INTRAVENOUS | Status: DC
  Administered 2016-09-06: 15:00:00 via INTRAVENOUS

## 2016-09-06 MED ORDER — HYDROXYZINE PAMOATE 50 MG PO CAPS
50.0000 mg | ORAL_CAPSULE | ORAL | Status: DC | PRN
  Filled 2016-09-06: qty 1

## 2016-09-06 MED ORDER — OXYTOCIN 10 UNIT/ML IJ SOLN
INTRAMUSCULAR | Status: AC
  Filled 2016-09-06: qty 4

## 2016-09-06 MED ORDER — PRENATAL MULTIVITAMIN CH
1.0000 | ORAL_TABLET | Freq: Every day | ORAL | Status: DC
  Administered 2016-09-07 – 2016-09-09 (×3): 1 via ORAL
  Filled 2016-09-06 (×4): qty 1

## 2016-09-06 MED ORDER — KETOROLAC TROMETHAMINE 30 MG/ML IJ SOLN: 30.0000 mg | Freq: Four times a day (QID) | INTRAMUSCULAR | Status: AC | PRN

## 2016-09-06 MED ORDER — ONDANSETRON HCL 4 MG/2ML IJ SOLN: 4.0000 mg | Freq: Four times a day (QID) | INTRAMUSCULAR | Status: DC | PRN

## 2016-09-06 MED ORDER — ONDANSETRON HCL 4 MG/2ML IJ SOLN
INTRAMUSCULAR | Status: AC
  Filled 2016-09-06: qty 2

## 2016-09-06 MED ORDER — NALBUPHINE HCL 10 MG/ML IJ SOLN: 5.0000 mg | Freq: Once | INTRAMUSCULAR | Status: DC | PRN

## 2016-09-06 MED ORDER — CLOZAPINE 100 MG PO TABS
200.0000 mg | ORAL_TABLET | Freq: Every day | ORAL | Status: DC
  Administered 2016-09-06 – 2016-09-08 (×3): 200 mg via ORAL
  Filled 2016-09-06 (×4): qty 2

## 2016-09-06 MED ORDER — SODIUM CHLORIDE 0.9% FLUSH: 3.0000 mL | INTRAVENOUS | Status: DC | PRN

## 2016-09-06 MED ORDER — OXYTOCIN 10 UNIT/ML IJ SOLN
INTRAVENOUS | Status: DC | PRN
  Administered 2016-09-06: 40 [IU] via INTRAVENOUS

## 2016-09-06 MED ORDER — LITHIUM CARBONATE 300 MG PO CAPS
300.0000 mg | ORAL_CAPSULE | Freq: Every day | ORAL | Status: DC
  Administered 2016-09-06 – 2016-09-09 (×4): 300 mg via ORAL
  Filled 2016-09-06 (×5): qty 1

## 2016-09-06 MED ORDER — MORPHINE SULFATE (PF) 0.5 MG/ML IJ SOLN
INTRAMUSCULAR | Status: DC | PRN
  Administered 2016-09-06: 2 mg via EPIDURAL
  Administered 2016-09-06: 1 mg via INTRAVENOUS
  Administered 2016-09-06: 2 mg via EPIDURAL

## 2016-09-06 MED ORDER — PHENYLEPHRINE 40 MCG/ML (10ML) SYRINGE FOR IV PUSH (FOR BLOOD PRESSURE SUPPORT)
PREFILLED_SYRINGE | INTRAVENOUS | Status: AC
  Filled 2016-09-06: qty 10

## 2016-09-06 MED ORDER — ZOLPIDEM TARTRATE 5 MG PO TABS: 5.0000 mg | ORAL_TABLET | Freq: Every evening | ORAL | Status: DC | PRN

## 2016-09-06 MED ORDER — LITHIUM CITRATE 300 MG/5 ML PO SYRP: 600.0000 mg | Freq: Every day | ORAL | Status: DC

## 2016-09-06 MED ORDER — MEPERIDINE HCL 25 MG/ML IJ SOLN
INTRAMUSCULAR | Status: AC
  Filled 2016-09-06: qty 1

## 2016-09-06 MED ORDER — ACETAMINOPHEN 325 MG PO TABS
650.0000 mg | ORAL_TABLET | ORAL | Status: DC | PRN
  Administered 2016-09-07 – 2016-09-09 (×5): 650 mg via ORAL
  Filled 2016-09-06 (×5): qty 2

## 2016-09-06 MED ORDER — KETOROLAC TROMETHAMINE 30 MG/ML IJ SOLN
INTRAMUSCULAR | Status: AC
Start: 2016-09-06 — End: 2016-09-06
  Administered 2016-09-06: 30 mg via INTRAVENOUS
  Filled 2016-09-06: qty 1

## 2016-09-06 MED ORDER — PHENYLEPHRINE HCL 10 MG/ML IJ SOLN
INTRAMUSCULAR | Status: DC | PRN
  Administered 2016-09-06 (×2): 80 ug via INTRAVENOUS

## 2016-09-06 MED ORDER — IBUPROFEN 600 MG PO TABS
600.0000 mg | ORAL_TABLET | Freq: Four times a day (QID) | ORAL | Status: DC
  Administered 2016-09-06 – 2016-09-07 (×2): 600 mg via ORAL
  Filled 2016-09-06 (×3): qty 1

## 2016-09-06 MED ORDER — SCOPOLAMINE 1 MG/3DAYS TD PT72
MEDICATED_PATCH | TRANSDERMAL | Status: DC | PRN
  Administered 2016-09-06: 1 via TRANSDERMAL

## 2016-09-06 MED ORDER — OXYTOCIN 40 UNITS IN LACTATED RINGERS INFUSION - SIMPLE MED: 2.5000 [IU]/h | INTRAVENOUS | Status: AC

## 2016-09-06 MED ORDER — DEXAMETHASONE SODIUM PHOSPHATE 4 MG/ML IJ SOLN
INTRAMUSCULAR | Status: AC
Start: 2016-09-06 — End: 2016-09-06
  Filled 2016-09-06: qty 1

## 2016-09-06 MED ORDER — SCOPOLAMINE 1 MG/3DAYS TD PT72
MEDICATED_PATCH | TRANSDERMAL | Status: AC
  Filled 2016-09-06: qty 1

## 2016-09-06 MED ORDER — DEXAMETHASONE SODIUM PHOSPHATE 4 MG/ML IJ SOLN
INTRAMUSCULAR | Status: DC | PRN
  Administered 2016-09-06: 4 mg via INTRAVENOUS

## 2016-09-06 MED ORDER — SIMETHICONE 80 MG PO CHEW
80.0000 mg | CHEWABLE_TABLET | Freq: Three times a day (TID) | ORAL | Status: DC
  Administered 2016-09-06 – 2016-09-09 (×11): 80 mg via ORAL
  Filled 2016-09-06 (×11): qty 1

## 2016-09-06 MED ORDER — SENNOSIDES-DOCUSATE SODIUM 8.6-50 MG PO TABS
2.0000 | ORAL_TABLET | ORAL | Status: DC
  Administered 2016-09-07 – 2016-09-08 (×3): 2 via ORAL
  Filled 2016-09-06 (×3): qty 2

## 2016-09-06 MED ORDER — HYDROMORPHONE HCL 1 MG/ML IJ SOLN: 0.2500 mg | INTRAMUSCULAR | Status: DC | PRN

## 2016-09-06 MED ORDER — MAGNESIUM 250 MG PO TABS: 62.5000 mg | ORAL_TABLET | Freq: Every day | ORAL | Status: DC

## 2016-09-06 MED ORDER — LITHIUM CITRATE 300 MG/5 ML PO SYRP: 300.0000 mg | Freq: Every day | ORAL | Status: DC

## 2016-09-06 MED ORDER — NALOXONE HCL 0.4 MG/ML IJ SOLN: 0.4000 mg | INTRAMUSCULAR | Status: DC | PRN

## 2016-09-06 MED ORDER — WITCH HAZEL-GLYCERIN EX PADS: 1.0000 "application " | MEDICATED_PAD | CUTANEOUS | Status: DC | PRN

## 2016-09-06 MED ORDER — SIMETHICONE 80 MG PO CHEW
80.0000 mg | CHEWABLE_TABLET | ORAL | Status: DC
  Administered 2016-09-07 – 2016-09-08 (×3): 80 mg via ORAL
  Filled 2016-09-06 (×3): qty 1

## 2016-09-06 MED ORDER — SODIUM BICARBONATE 8.4 % IV SOLN
INTRAVENOUS | Status: DC | PRN
  Administered 2016-09-06 (×2): 5 mL via EPIDURAL

## 2016-09-06 MED ORDER — ONDANSETRON HCL 4 MG/2ML IJ SOLN
INTRAMUSCULAR | Status: DC | PRN
  Administered 2016-09-06: 4 mg via INTRAVENOUS

## 2016-09-06 MED ORDER — SCOPOLAMINE 1 MG/3DAYS TD PT72
1.0000 | MEDICATED_PATCH | Freq: Once | TRANSDERMAL | Status: DC
  Filled 2016-09-06: qty 1

## 2016-09-06 MED ORDER — OXYCODONE HCL 5 MG PO TABS: 5.0000 mg | ORAL_TABLET | Freq: Once | ORAL | Status: DC | PRN

## 2016-09-06 MED ORDER — TETANUS-DIPHTH-ACELL PERTUSSIS 5-2.5-18.5 LF-MCG/0.5 IM SUSP: 0.5000 mL | Freq: Once | INTRAMUSCULAR | Status: DC

## 2016-09-06 MED ORDER — DIPHENHYDRAMINE HCL 25 MG PO CAPS
25.0000 mg | ORAL_CAPSULE | Freq: Four times a day (QID) | ORAL | Status: DC | PRN
Start: 2016-09-06 — End: 2016-09-09

## 2016-09-06 MED ORDER — DIPHENHYDRAMINE HCL 50 MG/ML IJ SOLN: 12.5000 mg | INTRAMUSCULAR | Status: DC | PRN

## 2016-09-06 MED ORDER — MEPERIDINE HCL 25 MG/ML IJ SOLN
INTRAMUSCULAR | Status: DC | PRN
Start: 2016-09-06 — End: 2016-09-06
  Administered 2016-09-06 (×2): 12.5 mg via INTRAVENOUS

## 2016-09-06 MED ORDER — LITHIUM CARBONATE 300 MG PO CAPS
600.0000 mg | ORAL_CAPSULE | Freq: Every day | ORAL | Status: DC
  Administered 2016-09-06 – 2016-09-08 (×3): 600 mg via ORAL
  Filled 2016-09-06 (×4): qty 2

## 2016-09-06 MED ORDER — MENTHOL 3 MG MT LOZG: 1.0000 | LOZENGE | OROMUCOSAL | Status: DC | PRN

## 2016-09-06 MED ORDER — OXYCODONE HCL 5 MG/5ML PO SOLN: 5.0000 mg | Freq: Once | ORAL | Status: DC | PRN

## 2016-09-06 MED ORDER — MORPHINE SULFATE (PF) 0.5 MG/ML IJ SOLN
INTRAMUSCULAR | Status: AC
  Filled 2016-09-06: qty 10

## 2016-09-06 MED ORDER — NALOXONE HCL 2 MG/2ML IJ SOSY
1.0000 ug/kg/h | PREFILLED_SYRINGE | INTRAMUSCULAR | Status: DC | PRN
  Filled 2016-09-06: qty 2

## 2016-09-06 MED ORDER — SIMETHICONE 80 MG PO CHEW: 80.0000 mg | CHEWABLE_TABLET | ORAL | Status: DC | PRN

## 2016-09-06 MED ORDER — DIBUCAINE 1 % RE OINT: 1.0000 "application " | TOPICAL_OINTMENT | RECTAL | Status: DC | PRN

## 2016-09-06 MED ORDER — MEPERIDINE HCL 25 MG/ML IJ SOLN: 6.2500 mg | INTRAMUSCULAR | Status: DC | PRN

## 2016-09-06 MED ORDER — DIPHENHYDRAMINE HCL 25 MG PO CAPS: 25.0000 mg | ORAL_CAPSULE | ORAL | Status: DC | PRN

## 2016-09-06 MED ORDER — KETOROLAC TROMETHAMINE 30 MG/ML IJ SOLN
30.0000 mg | Freq: Four times a day (QID) | INTRAMUSCULAR | Status: AC | PRN
  Administered 2016-09-06: 30 mg via INTRAVENOUS

## 2016-09-06 MED ORDER — LACTATED RINGERS IV SOLN
INTRAVENOUS | Status: DC | PRN
  Administered 2016-09-06: 03:00:00 via INTRAVENOUS

## 2016-09-06 MED ORDER — ONDANSETRON HCL 4 MG/2ML IJ SOLN: 4.0000 mg | Freq: Three times a day (TID) | INTRAMUSCULAR | Status: DC | PRN

## 2016-09-06 MED ORDER — COCONUT OIL OIL: 1.0000 "application " | TOPICAL_OIL | Status: DC | PRN

## 2016-09-06 SURGICAL SUPPLY — 36 items
APL SKNCLS STERI-STRIP NONHPOA (GAUZE/BANDAGES/DRESSINGS) ×1
BENZOIN TINCTURE PRP APPL 2/3 (GAUZE/BANDAGES/DRESSINGS) ×1 IMPLANT
CHLORAPREP W/TINT 26ML (MISCELLANEOUS) ×2 IMPLANT
CLAMP CORD UMBIL (MISCELLANEOUS) IMPLANT
CLOTH BEACON ORANGE TIMEOUT ST (SAFETY) ×2 IMPLANT
DRSG OPSITE POSTOP 4X10 (GAUZE/BANDAGES/DRESSINGS) ×2 IMPLANT
ELECT REM PT RETURN 9FT ADLT (ELECTROSURGICAL) ×2
ELECTRODE REM PT RTRN 9FT ADLT (ELECTROSURGICAL) ×1 IMPLANT
EXTRACTOR VACUUM KIWI (MISCELLANEOUS) IMPLANT
GLOVE BIO SURGEON ST LM GN SZ9 (GLOVE) ×2 IMPLANT
GLOVE BIOGEL PI IND STRL 7.0 (GLOVE) ×1 IMPLANT
GLOVE BIOGEL PI IND STRL 9 (GLOVE) ×1 IMPLANT
GLOVE BIOGEL PI INDICATOR 7.0 (GLOVE) ×1
GLOVE BIOGEL PI INDICATOR 9 (GLOVE) ×1
GOWN STRL REUS W/TWL 2XL LVL3 (GOWN DISPOSABLE) ×2 IMPLANT
GOWN STRL REUS W/TWL LRG LVL3 (GOWN DISPOSABLE) ×2 IMPLANT
NEEDLE HYPO 25X5/8 SAFETYGLIDE (NEEDLE) IMPLANT
NS IRRIG 1000ML POUR BTL (IV SOLUTION) ×2 IMPLANT
PACK C SECTION WH (CUSTOM PROCEDURE TRAY) ×2 IMPLANT
PAD OB MATERNITY 4.3X12.25 (PERSONAL CARE ITEMS) ×2 IMPLANT
PENCIL SMOKE EVAC W/HOLSTER (ELECTROSURGICAL) ×2 IMPLANT
RTRCTR C-SECT PINK 25CM LRG (MISCELLANEOUS) IMPLANT
RTRCTR C-SECT PINK 34CM XLRG (MISCELLANEOUS) IMPLANT
STRIP CLOSURE SKIN 1/2X4 (GAUZE/BANDAGES/DRESSINGS) ×2 IMPLANT
SUT MNCRL 0 VIOLET CTX 36 (SUTURE) ×2 IMPLANT
SUT MONOCRYL 0 CTX 36 (SUTURE) ×2
SUT PLAIN 2 0 (SUTURE) ×2
SUT PLAIN ABS 2-0 CT1 27XMFL (SUTURE) ×1 IMPLANT
SUT VIC AB 0 CT1 27 (SUTURE) ×2
SUT VIC AB 0 CT1 27XBRD ANBCTR (SUTURE) ×1 IMPLANT
SUT VIC AB 2-0 CT1 27 (SUTURE) ×2
SUT VIC AB 2-0 CT1 TAPERPNT 27 (SUTURE) ×1 IMPLANT
SUT VIC AB 4-0 KS 27 (SUTURE) ×2 IMPLANT
SYR BULB IRRIGATION 50ML (SYRINGE) IMPLANT
TOWEL OR 17X24 6PK STRL BLUE (TOWEL DISPOSABLE) ×2 IMPLANT
TRAY FOLEY CATH SILVER 14FR (SET/KITS/TRAYS/PACK) ×2 IMPLANT

## 2016-09-06 NOTE — Op Note (Signed)
09/05/2016 - 09/06/2016  3:54 AM  PATIENT:  Carolyn Wright  41 y.o. female  PRE-OPERATIVE DIAGNOSIS:  Primary C-Section for fetal intolerance of labor  POST-OPERATIVE DIAGNOSIS:  Primary C-Section for fetal intolerance of labor  PROCEDURE:  Procedure(s): CESAREAN SECTION (N/A)  SURGEON:  Surgeon(s) and Role:    * Tilda BurrowJohn Kelcey Korus V, MD - Primary  PHYSICIAN ASSISTANT:   ASSISTANTS: none   ANESTHESIA:   epidural  EBL:  Total I/O In: 3000 [I.V.:3000] Out: 1150 [Urine:150; Blood:1000]  BLOOD ADMINISTERED:none  DRAINS: Urinary Catheter (Foley)   LOCAL MEDICATIONS USED:  NONE  SPECIMEN:  Source of Specimen:  Placenta  DISPOSITION OF SPECIMEN:  PATHOLOGY  COUNTS:  YES  TOURNIQUET:  * No tourniquets in log *  DICTATION: .Dragon Dictation  PLAN OF CARE: Patient has active admission orders  PATIENT DISPOSITION:  PACU - hemodynamically stable.   Delay start of Pharmacological VTE agent (>24hrs) due to surgical blood loss or risk of bleeding: not applicable Indications a 41 year old at 6137 weeks admitted for induction of labor with BPP 4 out of 10. During induction the patient developed recurrent late decelerations, remote from delivery at 3 cm. Cesarean section recommended and performed Details of procedure. Patient was taken to the operating room prepped and draped for lower abdominal surgery. Timeout was quickly conducted and is an administered per IV. Transverse lower abdominal incision performed in standard fashion. Fascia was opened transversely, peritoneum opened bluntly and sharply extended superiorly and inferiorly and Alexis wound retractor positioned in place. The lower uterine segment was rotated somewhat in the abdominal cavity. Efforts were made to make a transverse incision but it turned out to be slightly oblique. It was extended down to the amniotic sac which was opened. Incision was extended transversely then cephalad and caudad and fundal pressure applied to deliver the  fetal vertex. Amniotic fluid was a yellow discolored without any malodor. Fetus was delivered, cord clamped approximately 1 minute, and the infant passed off where Apgars were assigned. See pediatrician's notes regarding the baby. Pitocin was infused and placenta spontaneously expelled Tomasa BlaseSchultz presentation. Membrane remnants were extracted from the lower uterine segment. No varicosities in the lower uterine segment which resulted in increased blood loss. These were controlled during the closure with ring forceps. Was closed in a running continuous 2 layer closure excellent tissue approximation and hemostasis achieved. Abdomen was in. Alexis retractor removed, peritoneum closed anteriorly with 2-0 Vicryl, fascia closed with running 0 Vicryl. Subcutaneous tissues were inspected and coagulation with cautery used as necessary then 20 plain sutures 4 used to reapproximate subcutaneous space and then subcuticular 4-0 Vicryl skin closure. Blood loss was greater than normal with approximately 1000 cc EBL primarily due to the varicosities in the lower uterine segment. Good uterine tone was achieved through the case. Sponge and needle counts correct Details of procedure:

## 2016-09-06 NOTE — Brief Op Note (Signed)
09/05/2016 - 09/06/2016  3:54 AM  PATIENT:  Carolyn Wright  41 y.o. female  PRE-OPERATIVE DIAGNOSIS:  Primary C-Section for fetal intolerance of labor  POST-OPERATIVE DIAGNOSIS:  Primary C-Section for fetal intolerance of labor  PROCEDURE:  Procedure(s): CESAREAN SECTION (N/A)  SURGEON:  Surgeon(s) and Role:    * Tilda BurrowJohn Puanani Gene V, MD - Primary  PHYSICIAN ASSISTANT:   ASSISTANTS: none   ANESTHESIA:   epidural  EBL:  Total I/O In: 3000 [I.V.:3000] Out: 1150 [Urine:150; Blood:1000]  BLOOD ADMINISTERED:none  DRAINS: Urinary Catheter (Foley)   LOCAL MEDICATIONS USED:  NONE  SPECIMEN:  Source of Specimen:  Placenta  DISPOSITION OF SPECIMEN:  PATHOLOGY  COUNTS:  YES  TOURNIQUET:  * No tourniquets in log *  DICTATION: .Dragon Dictation  PLAN OF CARE: Patient has active admission orders  PATIENT DISPOSITION:  PACU - hemodynamically stable.   Delay start of Pharmacological VTE agent (>24hrs) due to surgical blood loss or risk of bleeding: not applicable

## 2016-09-06 NOTE — Anesthesia Postprocedure Evaluation (Addendum)
Anesthesia Post Note  Patient: Soyla DryerVivian Nonaka  Procedure(s) Performed: Procedure(s) (LRB): CESAREAN SECTION (N/A)  Patient location during evaluation: Mother Baby Anesthesia Type: Epidural Level of consciousness: awake and alert and oriented Pain management: pain level controlled Vital Signs Assessment: post-procedure vital signs reviewed and stable Respiratory status: spontaneous breathing and nonlabored ventilation Cardiovascular status: stable Postop Assessment: no headache, no backache, patient able to bend at knees, no signs of nausea or vomiting and adequate PO intake Anesthetic complications: no        Last Vitals:  Vitals:   09/06/16 0548 09/06/16 0615  BP: (!) 101/59 102/60  Pulse: 81 84  Resp: 16 16  Temp: 36.6 C 36.8 C    Last Pain:  Vitals:   09/06/16 0615  TempSrc: Oral  PainSc:    Pain Goal: Patients Stated Pain Goal: 3 (09/06/16 0500)               Madison HickmanGREGORY,SUZANNE

## 2016-09-06 NOTE — Progress Notes (Signed)
Dr. Emelda FearFerguson at bedside discussing risks and benefits of C/S with patient and family. Questions answered. Consents signed

## 2016-09-06 NOTE — Progress Notes (Signed)
Soyla DryerVivian Berkey is a 41 y.o. [id number] at 5466w3d  [facility] F[facility] bulb placed earlier, now expelled.  Subjective: FHR shows late decelerations. Pitocin d/c'd  Objective: BP (!) 91/52   Pulse 83   Temp 98.1 F (36.7 C) (Oral)   Resp 20   Ht 5\' 2"  (1.575 m)   Wt 94.2 kg (207 lb 11.2 oz)   LMP  (LMP Unknown)   SpO2 98%   BMI 37.99 kg/m  No intake/output data recorded. No intake/output data recorded.  FHT:  FHR: 125 bpm, variability: minimal ,  accelerations:  Abscent,  decelerations:  Present late decels. UC:   regular, every q5 minutes SVE:   Dilation: 3.5 Effacement (%): 50 Station: -3 Exam by:: Cletis MediaK. Anderson, RN  Labs: Lab Results  Component Value Date   WBC 14.3 (H) 09/05/2016   HGB 11.0 (L) 09/05/2016   HCT 33.0 (L) 09/05/2016   MCV 92.4 09/05/2016   PLT 329 09/05/2016    Assessment / Plan: fetal intolerance of labor, distant from delivery.   Labor: Pitocin d/c'd and Terbutalene administered. Preeclampsia:   Fetal Wellbeing:  Category II Pain Control:  Epidural I/D:  n/a Anticipated MOD:  Cesarean section recommended, Risks, benefits reviewed. House coverage informed. To OR urgently.  Rolena Knutson V 09/06/2016, 2:33 AM

## 2016-09-06 NOTE — Transfer of Care (Signed)
Immediate Anesthesia Transfer of Care Note  Patient: Soyla DryerVivian Suchan  Procedure(s) Performed: Procedure(s): CESAREAN SECTION (N/A)  Patient Location: PACU  Anesthesia Type:Epidural  Level of Consciousness: awake, alert  and oriented  Airway & Oxygen Therapy: Patient Spontanous Breathing  Post-op Assessment: Report given to RN and Post -op Vital signs reviewed and stable  Post vital signs: Reviewed and stable  Last Vitals:  Vitals:   09/06/16 0133 09/06/16 0207  BP: (!) 91/56 (!) 91/52  Pulse: 88 83  Resp: 20 20  Temp:      Last Pain:  Vitals:   09/06/16 0119  TempSrc: Oral  PainSc:       Patients Stated Pain Goal: 5 (09/05/16 1410)  Complications: No apparent anesthesia complications

## 2016-09-06 NOTE — Progress Notes (Signed)
UR chart review completed.  

## 2016-09-07 LAB — CBC
HCT: 26.6 % — ABNORMAL LOW (ref 36.0–46.0)
Hemoglobin: 8.9 g/dL — ABNORMAL LOW (ref 12.0–15.0)
MCH: 31.2 pg (ref 26.0–34.0)
MCHC: 33.5 g/dL (ref 30.0–36.0)
MCV: 93.3 fL (ref 78.0–100.0)
PLATELETS: 296 10*3/uL (ref 150–400)
RBC: 2.85 MIL/uL — AB (ref 3.87–5.11)
RDW: 17.7 % — ABNORMAL HIGH (ref 11.5–15.5)
WBC: 16.3 10*3/uL — AB (ref 4.0–10.5)

## 2016-09-07 LAB — LITHIUM LEVEL: LITHIUM LVL: 0.87 mmol/L (ref 0.60–1.20)

## 2016-09-07 LAB — BASIC METABOLIC PANEL
ANION GAP: 9 (ref 5–15)
BUN: 8 mg/dL (ref 6–20)
CALCIUM: 9.2 mg/dL (ref 8.9–10.3)
CO2: 20 mmol/L — ABNORMAL LOW (ref 22–32)
Chloride: 106 mmol/L (ref 101–111)
Creatinine, Ser: 0.83 mg/dL (ref 0.44–1.00)
GFR calc Af Amer: >60 mL/min (ref >60)
Glucose, Bld: 148 mg/dL — ABNORMAL HIGH (ref 65–99)
POTASSIUM: 3.7 mmol/L (ref 3.5–5.1)
SODIUM: 135 mmol/L (ref 135–145)

## 2016-09-07 LAB — TSH: TSH: 3.762 u[IU]/mL (ref 0.350–4.500)

## 2016-09-07 MED ORDER — OXYCODONE HCL 5 MG PO TABS: 5.0000 mg | ORAL_TABLET | ORAL | Status: DC | PRN

## 2016-09-07 MED ORDER — TRAMADOL HCL 50 MG PO TABS
100.0000 mg | ORAL_TABLET | Freq: Four times a day (QID) | ORAL | Status: DC | PRN
  Administered 2016-09-07 – 2016-09-09 (×9): 100 mg via ORAL
  Filled 2016-09-07 (×9): qty 2

## 2016-09-07 NOTE — Progress Notes (Signed)
CSW acknowledged consult and attempted to meet with MOB.  MOB requested CSW to return when MOB's husband is present so he can participate in the clinical  assessment. CSW agreed to return at a later time and provided MOB with CSW contact information.  CSW requested MOB to contact CSW when FOB returns.  CSW also informed bedside nurse of MOB's request and bedside nurse has also agreed to contact CSW when FOB returns.   Blaine HamperAngel Boyd-Gilyard, MSW, LCSW Clinical Social Work [phone]
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
## 2016-09-07 NOTE — Progress Notes (Signed)
Pt c/o dizziness in bathroom about 0430. Assisted back to bed with 2 RN assist and steady lift. BP taken in bed 146/53 but pt shivering, reporting feeling cold and pain 10/10. Documented allergy to hydrocodone. Tylenol given but pt requesting more pain medication. Drenda FreezeFran CNM called. Oxy IR ordered. Asked pt about reaction to hydrocodone as it wasn't listed. Pt states hallucinations. Asked pt if she has taken oxycodone before, she states she has for her foot in the past and had "altered mental status." Pt states she has taken tramadol for her head in the past without any problems. Called Drenda FreezeFran back. Order received for tramadol.

## 2016-09-08 NOTE — Progress Notes (Signed)
Post Partum Day/POD #1 Subjective: no complaints, up ad lib, voiding and tolerating PO  Objective: Blood pressure 114/70, pulse (!) 112, temperature 99.3 F (37.4 C), temperature source Oral, resp. rate 20, height 5\' 2"  (1.575 m), weight 94.2 kg (207 lb 11.2 oz), SpO2 97 %, unknown if currently breastfeeding.  Physical Exam:  General: alert Lochia: appropriate Uterine Fundus: firm and appropriately tender at U-1 Honeycomb dressing: c/d/ DVT Evaluation: No evidence of DVT seen on physical exam.   Recent Labs  09/06/16 0627 09/07/16 1525  HGB 9.2* 8.9*  HCT 27.1* 26.6*    Assessment/Plan: Plan for discharge tomorrow   LOS: 3 days   Werner Labella C Mearle Drew 09/08/2016, 8:02 AM

## 2016-09-09 MED ORDER — TRAMADOL HCL 50 MG PO TABS: 100.0000 mg | ORAL_TABLET | Freq: Four times a day (QID) | ORAL | 1 refills | Status: DC | PRN

## 2016-09-09 NOTE — Progress Notes (Signed)
CSW received a phone call from central nursery noting they have concerns for babys d/c home with MOB. Upon further review of the chart, this writer saw noted that week day CSW StapletonAngel saw and assessed MOB and the baby. Per CSW Angel's note, a CPS report was made and screened in and will be followed by CPS worker Shanda BumpsJessica. This Clinical research associatewriter informed central nursery pediatrician Idalia Needleaige of this. Paige verbalized understanding noting she still did not feel comfortable d/c baby in the care of MOB. This Clinical research associatewriter informed Idalia Needleaige that she could follow-up with CPS if she had new concerns, other than the ones already reported. Idalia Needleaige noted they were not new but would feel more comfortable if CPS came out to the hospital today. This Clinical research associatewriter [facility]; however, received no phone call back.   Mother baby RN contacted this Clinical research associatewriter requesting this Clinical research associatewriter see MOB again and document her current behaviors thus warranting pediatrician to feel more comfortable d/c MOB and baby home together. This Clinical research associatewriter laid eyes on baby and MOB.This Clinical research associatewriter has no new concerns other than the ones already reported and documented by CSW Lawanna Kobusngel thus there are no barriers to d/c at this time.   Tahlia Deamer, MSW, LCSW-A Clinical Social Worker  [facility]  Office: [phone]

## 2016-09-09 NOTE — Discharge Summary (Addendum)
Obstetric Discharge Summary Reason for Admission: induction of labor Prenatal Procedures: NST Intrapartum Procedures: cesarean: low cervical, transverse Postpartum Procedures: none Complications-Operative and Postpartum: none Hemoglobin  Date Value Ref Range Status  09/07/2016 8.9 (L) 12.0 - 15.0 g/dL Final  16/10/960407/31/2017 54.011.8 g/dL Final   HCT  Date Value Ref Range Status  09/07/2016 26.6 (L) 36.0 - 46.0 % Final  02/20/2016 37 % Final    Physical Exam:  General: alert Lochia: appropriate Uterine Fundus: firm and NT at U-2 Incision: honeycomb dressing c/d/i DVT Evaluation: No evidence of DVT seen on physical exam.  Discharge Diagnoses: Term Pregnancy-delivered  Discharge Information: Date: 09/09/2016 Activity: pelvic rest Diet: routine Medications: Ibuprofen and tramadol Condition: stable Instructions: refer to practice specific booklet Discharge to: home  She intends to use condoms until her FOB gets a vasectomy. Follow-up Information    Carolyn AntiguaPeggy Constant, MD. Schedule an appointment as soon as possible for a visit in 6 week(s).   Specialty:  Obstetrics and Gynecology Contact information: [address] [place] [id number] [phone]           Newborn Data: Live born female  Birth Weight: 7 lb 5.5 oz (3330 g) APGAR: 9, 9  Home with mother.  Carolyn Wright 09/09/2016, 8:29 AM

## 2016-09-09 NOTE — Discharge Instructions (Signed)
Cesarean Delivery °Cesarean birth, or cesarean delivery, is the surgical delivery of a baby through an incision in the abdomen and the uterus. This may be referred to as a C-section. This procedure may be scheduled ahead of time, or it may be done in an emergency situation. °Tell a health care provider about: °· Any allergies you have. °· All medicines you are taking, including vitamins, herbs, eye drops, creams, and over-the-counter medicines. °· Any problems you or family members have had with anesthetic medicines. °· Any blood disorders you have. °· Any surgeries you have had. °· Any medical conditions you have. °· Whether you or any members of your family have a history of deep vein thrombosis (DVT) or pulmonary embolism (PE). °What are the risks? °Generally, this is a safe procedure. However, problems may occur, including: °· Infection. °· Bleeding. °· Allergic reactions to medicines. °· Damage to other structures or organs. °· Blood clots. °· Injury to your baby. ° °What happens before the procedure? °· Follow instructions from your health care provider about eating or drinking restrictions. °· Follow instructions from your health care provider about bathing before your procedure to help reduce your risk of infection. °· If you know that you are going to have a cesarean delivery, do not shave your pubic area. Shaving before the procedure may increase your risk of infection. °· Ask your health care provider about: °? Changing or stopping your regular medicines. This is especially important if you are taking diabetes medicines or blood thinners. °? Your pain management plan. This is especially important if you plan to breastfeed your baby. °? How long you will be in the hospital after the procedure. °? Any concerns you may have about receiving blood products if you need them during the procedure. °? Cord blood banking, if you plan to collect your baby’s umbilical cord blood. °· You may also want to ask your  health care provider: °? Whether you will be able to hold or breastfeed your baby while you are still in the operating room. °? Whether your baby can stay with you immediately after the procedure and during your recovery. °? Whether a family member or a person of your choice can go with you into the operating room and stay with you during the procedure, immediately after the procedure, and during your recovery. °· Plan to have someone drive you home when you are discharged from the hospital. °What happens during the procedure? °· Fetal monitors will be placed on your abdomen to monitor your heart rate and your baby's heart rate. °· Depending on the reason for your cesarean delivery, you may have a physical exam or additional testing, such as an ultrasound. °· An IV tube will be inserted into one of your veins. °· You may have your blood or urine tested. °· You will be given antibiotic medicine to help prevent infection. °· You may be given a special warming gown to wear to keep your temperature stable. °· Hair may be removed from your pubic area. °· The skin of your pubic area and lower abdomen will be cleaned with a germ-killing solution (antiseptic). °· A catheter may be inserted into your bladder through your urethra. This drains your urine during the procedure. °· You may be given one or more of the following: °? A medicine to numb the area (local anesthetic). °? A medicine to make you fall asleep (general anesthetic). °? A medicine (regional anesthetic) that is injected into your back or through a small   thin tube placed in your back (spinal anesthetic or epidural anesthetic). This numbs everything below the injection site and allows you to stay awake during your procedure. If this makes you feel nauseous, tell your health care provider. Medicines will be available to help reduce any nausea you may feel. °· An incision will be made in your abdomen, and then in your uterus. °· If you are awake during your  procedure, you may feel tugging and pulling in your abdomen, but you should not feel pain. If you feel pain, tell your health care provider immediately. °· Your baby will be removed from your uterus. You may feel more pressure or pushing while this happens. °· Immediately after birth, your baby will be dried and kept warm. You may be able to hold and breastfeed your baby. The umbilical cord may be clamped and cut during this time. °· Your placenta will be removed from your uterus. °· Your incisions will be closed with stitches (sutures). Staples, skin glue, or adhesive strips may also be applied to the incision in your abdomen. °· Bandages (dressings) will be placed over the incision in your abdomen. °The procedure may vary among health care providers and hospitals. °What happens after the procedure? °· Your blood pressure, heart rate, breathing rate, and blood oxygen level will be monitored often until the medicines you were given have worn off. °· You may continue to receive fluids and medicines through an IV tube. °· You will have some pain. Medicines will be available to help control your pain. °· To help prevent blood clots: °? You may be given medicines. °? You may have to wear compression stockings or devices. °? You will be encouraged to walk around when you are able. °· Hospital staff will encourage and support bonding with your baby. Your hospital may allow you and your baby to stay in the same room (rooming in) during your hospital stay to encourage successful breastfeeding. °· You may be encouraged to cough and breathe deeply often. This helps to prevent lung problems. °· If you have a catheter draining your urine, it will be removed as soon as possible after your procedure. °This information is not intended to replace advice given to you by your health care provider. Make sure you discuss any questions you have with your health care provider. °Document Released: 07/09/2005 Document Revised: 12/15/2015  Document Reviewed: 04/19/2015 °Elsevier Interactive Patient Education © 2017 [employer] ° °

## 2016-09-10 ENCOUNTER — Other Ambulatory Visit: Payer: Self-pay | Admitting: Obstetrics and Gynecology

## 2016-09-12 ENCOUNTER — Other Ambulatory Visit: Payer: Self-pay | Admitting: Student

## 2016-09-17 ENCOUNTER — Inpatient Hospital Stay (HOSPITAL_COMMUNITY): Admission: RE | Admit: 2016-09-17 | Payer: Medicare Other | Source: Ambulatory Visit

## 2016-10-18 ENCOUNTER — Ambulatory Visit (INDEPENDENT_AMBULATORY_CARE_PROVIDER_SITE_OTHER): Payer: Medicare Other | Admitting: Obstetrics and Gynecology

## 2016-10-18 ENCOUNTER — Encounter: Payer: Self-pay | Admitting: Obstetrics and Gynecology

## 2016-10-18 DIAGNOSIS — Z30011 Encounter for initial prescription of contraceptive pills: Secondary | ICD-10-CM

## 2016-10-18 DIAGNOSIS — O09529 Supervision of elderly multigravida, unspecified trimester: Secondary | ICD-10-CM

## 2016-10-18 MED ORDER — LEVONORGESTREL-ETHINYL ESTRAD 0.15-30 MG-MCG PO TABS: 1.0000 | ORAL_TABLET | Freq: Every day | ORAL | 11 refills | Status: AC

## 2016-10-18 NOTE — Patient Instructions (Signed)

## 2016-10-18 NOTE — Progress Notes (Signed)
Subjective:     Carolyn Wright is a 41 y.o. female who presents for a postpartum visit. She is 6 weeks postpartum following a low cervical transverse Cesarean section for fetal indications. I have fully reviewed the prenatal and intrapartum course. The delivery was at 37 and 3 gestational weeks. Outcome: primary cesarean section, low transverse incision. Anesthesia: epidural. Postpartum course has been unremarkable. Baby's course has been unremarkable. Baby is feeding by bottle - Similac Sensitive RS. Bleeding no bleeding. Bowel function is normal. Bladder function is normal. Patient is not sexually active. Contraception method is OCP (estrogen/progesterone). Postpartum depression screening: negative.  The following portions of the patient's history were reviewed and updated as appropriate: allergies, current medications, past family history, past medical history, past social history, past surgical history and problem list.  Review of Systems Pertinent items are noted in HPI.   Objective:    BP 127/90   Pulse 97   Temp 98.1 F (36.7 C)   Ht 5\' 2"  (1.575 m)   Wt 175 lb (79.4 kg)   Breastfeeding? No   BMI 32.01 kg/m   General:  alert, cooperative and appears stated age   Breasts:  not preformed  Lungs: clear to auscultation bilaterally and normal percussion bilaterally  Heart:  regular rate and rhythm, S1, S2 normal, no murmur, click, rub or gallop  Abdomen: soft, non-tender; bowel sounds normal; no masses,  no organomegaly well healing scar   Vulva:  not evaluated  Vagina: not evaluated  Cervix:  not evlauated  Corpus: not examined  Adnexa:  not evaluated  Rectal Exam: Not performed.        Assessment:     Normal postpartum exam. Pap smear not done at today's visit.  ASCUS HPV negative 12/15/2014  Plan:    1. Contraception: OCP (estrogen/progesterone) 2. Normal postpartum exam 3. Follow up in: 1 year or as needed.

## 2017-03-13 NOTE — Addendum Note (Signed)
Addendum  created 03/13/17 1152 by Achille Rich, MD   Sign clinical note

## 2018-09-06 IMAGING — US US MFM FETAL BPP W/O NON-STRESS
1 series · 14 of 17 positions shown · non-contrast
Comparison: none

[Series 1: us mfm fetal bpp w/o non-stress · 17 acquisitions, 14 frames shown]
[im 1/17]
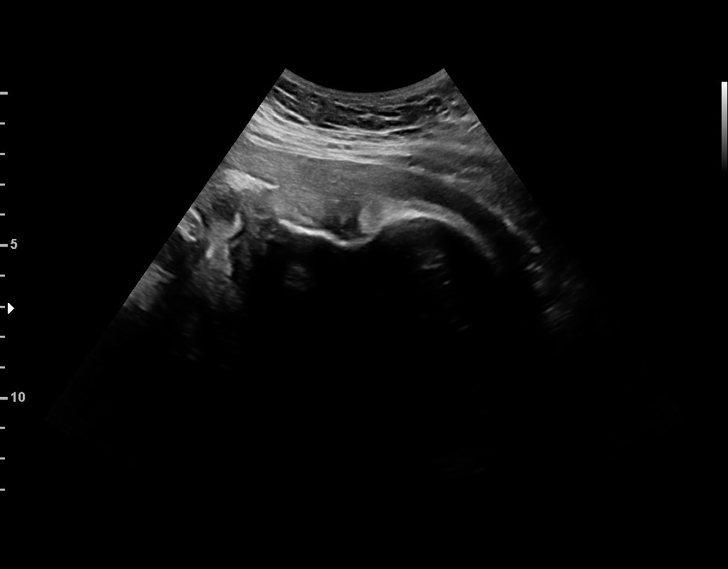
[im 2/17]
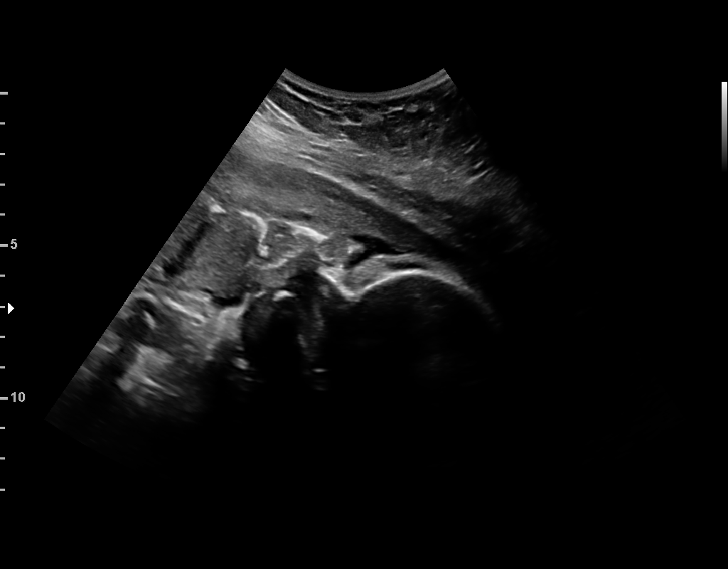
[im 4/17]
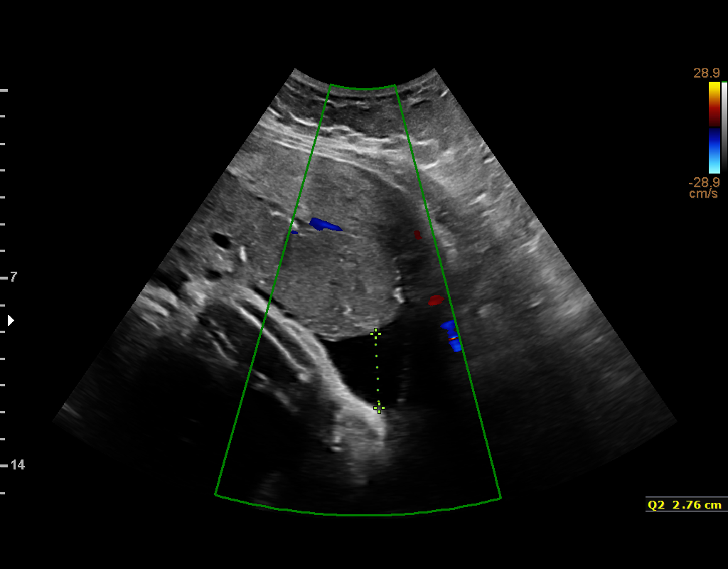
[im 5/17]
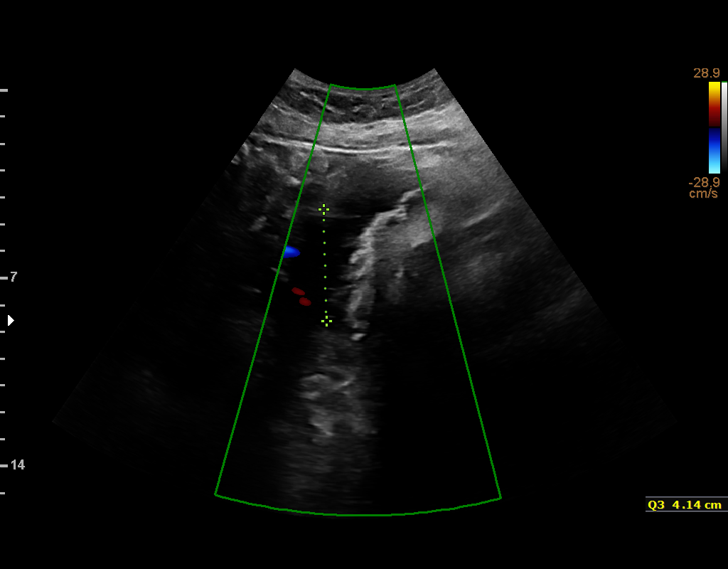
[im 6/17]
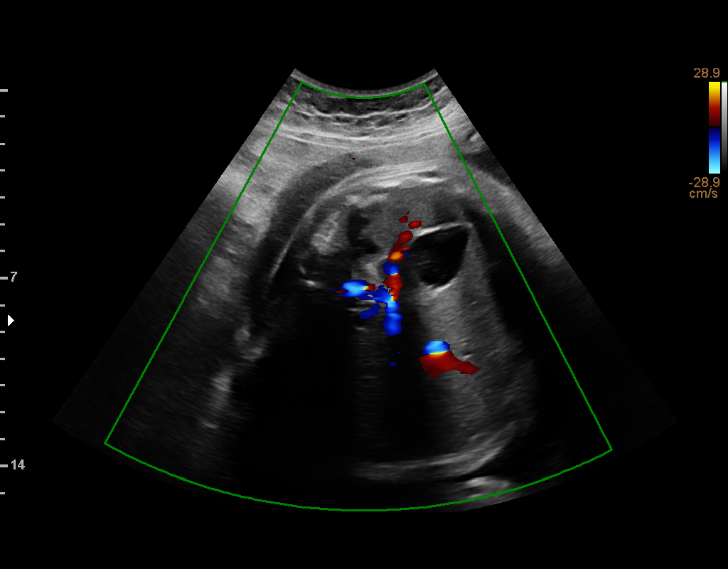
[im 7/17]
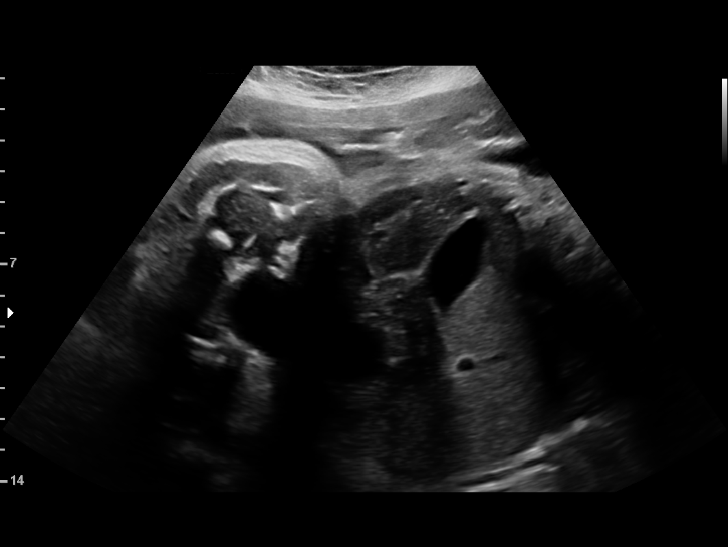
[im 8/17]
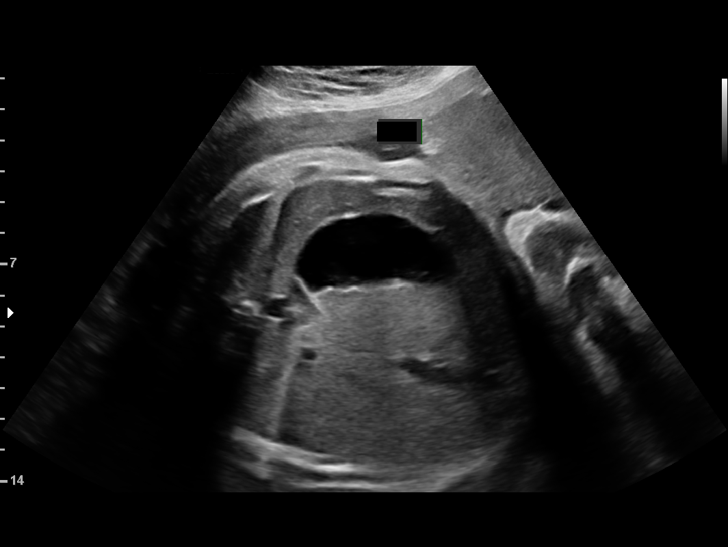
[im 10/17]
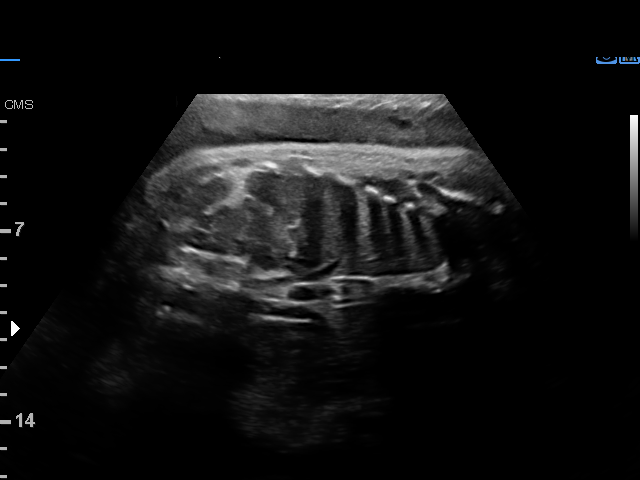
[im 11/17]
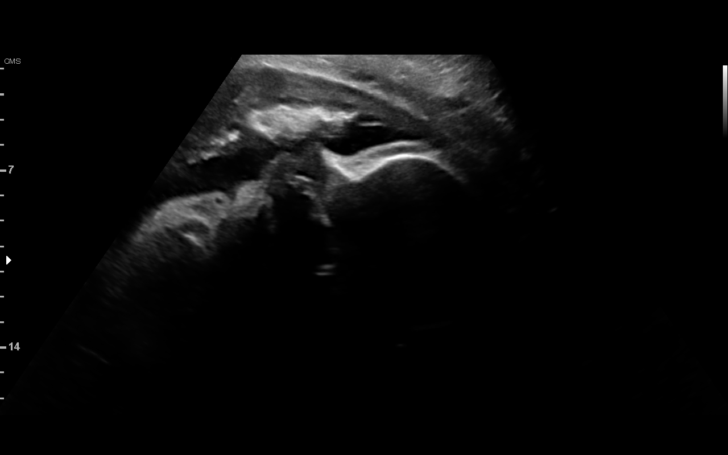
[im 12/17]
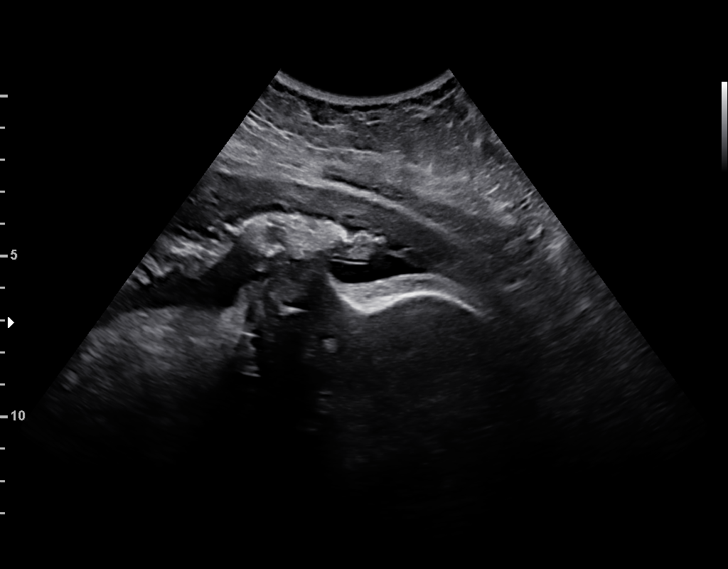
[im 13/17]
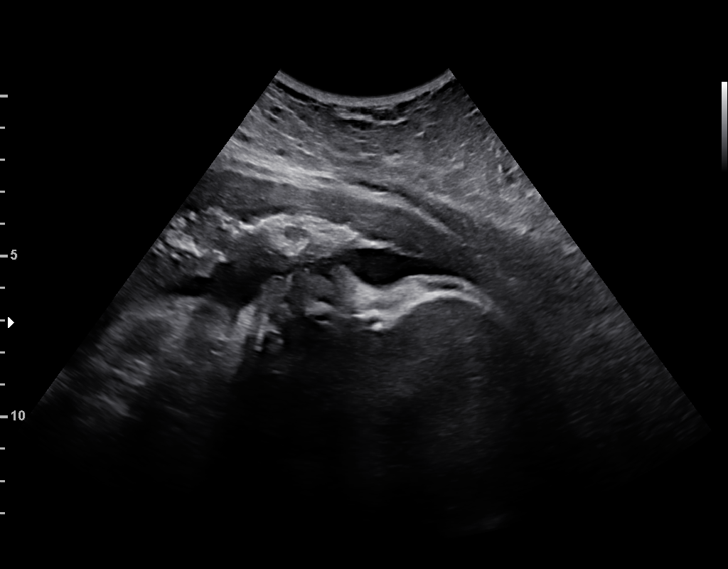
[im 14/17]
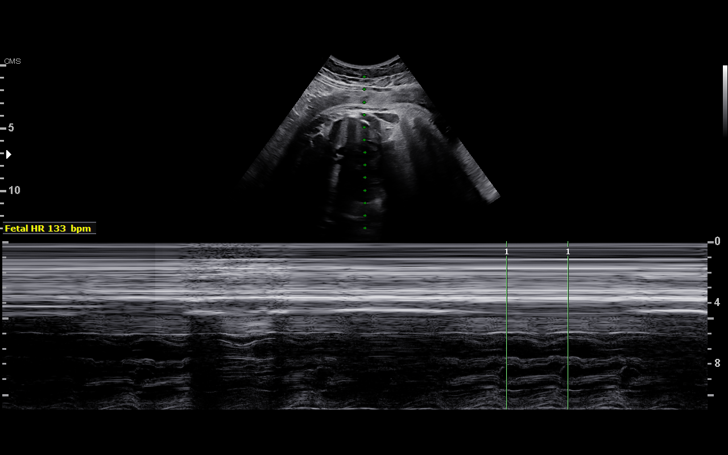
[im 16/17]
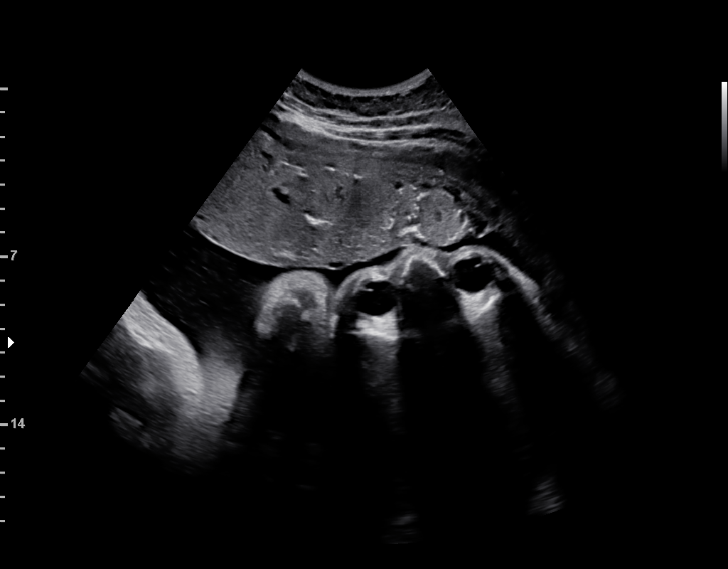
[im 17/17]
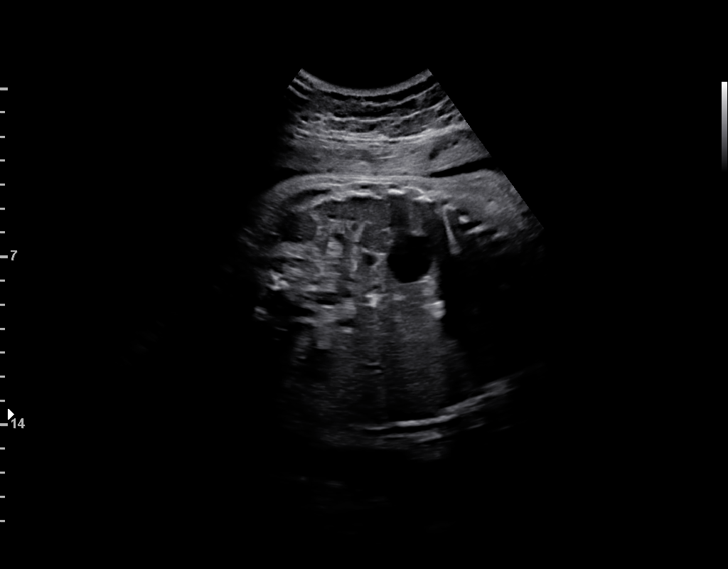

[14 of 17 positions shown; findings below may reference images not displayed]

1  UZOHO SAJO             [id number]      [id number]     [id number]
Indications

37 weeks gestation of pregnancy
Advanced maternal age multigravida 37+,
third trimester
Non-reactive NST
Hypertension - Chronic/Pre-existing
OB History

Gravidity:    4         Term:   1        Prem:   0        SAB:   2
TOP:          0       Ectopic:  0        Living: 1
Fetal Evaluation

Num Of Fetuses:     1
Fetal Heart         133
Rate(bpm):
Cardiac Activity:   Observed
Presentation:       Cephalic

Amniotic Fluid
AFI FV:      Subjectively within normal limits

AFI Sum(cm)     %Tile       Largest Pocket(cm)
13.06           47

RUQ(cm)                     LUQ(cm)        LLQ(cm)
6.16
Biophysical Evaluation

Amniotic F.V:   Within normal limits       F. Tone:        Not Observed
F. Movement:    Not Observed               Score:          [DATE]
F. Breathing:   Observed
Gestational Age

Best:          37w 2d     Det. By:  Early Ultrasound         EDD:   09/24/16
(02/21/16)
Impression

IUP at 37+2 weeks with CHTN and AMA, nonreactive NST
Amniotic fluid normal
BPP [DATE] minimal movement and tone, normal breathing and
fluid
Recommendations

Recommend continuous monitoring to assess fetal wellbeing.
